# Patient Record
Sex: Male | Born: 1980 | Race: White | Hispanic: No | Marital: Married | Smoking: Former smoker
Health system: Southern US, Community
[De-identification: ages and names within clinical notes are randomized; demographics above are authoritative.]

## PROBLEM LIST (undated history)

## (undated) DIAGNOSIS — I10 Essential (primary) hypertension: Secondary | ICD-10-CM

## (undated) HISTORY — PX: WISDOM TOOTH EXTRACTION: SHX21

## (undated) HISTORY — DX: Essential (primary) hypertension: I10

---

## 2016-10-11 ENCOUNTER — Encounter: Payer: Self-pay | Admitting: Family

## 2016-10-11 ENCOUNTER — Ambulatory Visit (INDEPENDENT_AMBULATORY_CARE_PROVIDER_SITE_OTHER): Payer: BC Managed Care – PPO | Admitting: Family

## 2016-10-11 VITALS — BP 110/74 | HR 87 | Temp 98.8°F | Resp 16 | Ht 70.0 in | Wt 203.0 lb

## 2016-10-11 DIAGNOSIS — Z0000 Encounter for general adult medical examination without abnormal findings: Secondary | ICD-10-CM | POA: Diagnosis not present

## 2016-10-11 DIAGNOSIS — K649 Unspecified hemorrhoids: Secondary | ICD-10-CM | POA: Diagnosis not present

## 2016-10-11 MED ORDER — HYDROCORTISONE 2.5 % RE CREA: 1.0000 "application " | TOPICAL_CREAM | Freq: Two times a day (BID) | RECTAL | 0 refills | Status: DC

## 2016-10-11 NOTE — Patient Instructions (Addendum)
Thank you for choosing [facility].  SUMMARY AND INSTRUCTIONS:  Labs:  Please stop by the lab on the lower level of the building for your blood work. Your results will be released to MyChart (or called to you) after review, usually within 72 hours after test completion. If any changes need to be made, you will be notified at that same time.  1.) The lab is open from 7:30am to 5:30 pm Monday-Friday 2.) No appointment is necessary 3.) Fasting (if needed) is 6-8 hours after food and drink; black coffee and water are okay   Follow up:  If your symptoms worsen or fail to improve, please contact our office for further instruction, or in case of emergency go directly to the emergency room at the closest medical facility.    Health Maintenance, Male A healthy lifestyle and preventive care is important for your health and wellness. Ask your health care provider about what schedule of regular examinations is right for you. What should I know about weight and diet?  Eat a Healthy Diet  Eat plenty of vegetables, fruits, whole grains, low-fat dairy products, and lean protein.  Do not eat a lot of foods high in solid fats, added sugars, or salt. Maintain a Healthy Weight  Regular exercise can help you achieve or maintain a healthy weight. You should:  Do at least 150 minutes of exercise each week. The exercise should increase your heart rate and make you sweat (moderate-intensity exercise).  Do strength-training exercises at least twice a week. Watch Your Levels of Cholesterol and Blood Lipids  Have your blood tested for lipids and cholesterol every 5 years starting at 38 years of age. If you are at high risk for heart disease, you should start having your blood tested when you are 38 years old. You may need to have your cholesterol levels checked more often if:  Your lipid or cholesterol levels are high.  You are older than 38 years of age.  You are at high risk for heart  disease. What should I know about cancer screening? Many types of cancers can be detected early and may often be prevented. Lung Cancer  You should be screened every year for lung cancer if:  You are a current smoker who has smoked for at least 30 years.  You are a former smoker who has quit within the past 15 years.  Talk to your health care provider about your screening options, when you should start screening, and how often you should be screened. Colorectal Cancer  Routine colorectal cancer screening usually begins at 38 years of age and should be repeated every 5-10 years until you are 38 years old. You may need to be screened more often if early forms of precancerous polyps or small growths are found. Your health care provider may recommend screening at an earlier age if you have risk factors for colon cancer.  Your health care provider may recommend using home test kits to check for hidden blood in the stool.  A small camera at the end of a tube can be used to examine your colon (sigmoidoscopy or colonoscopy). This checks for the earliest forms of colorectal cancer. Prostate and Testicular Cancer  Depending on your age and overall health, your health care provider may do certain tests to screen for prostate and testicular cancer.  Talk to your health care provider about any symptoms or concerns you have about testicular or prostate cancer. Skin Cancer  Check your skin from head to   toe regularly.  Tell your health care provider about any new moles or changes in moles, especially if:  There is a change in a mole's size, shape, or color.  You have a mole that is larger than a pencil eraser.  Always use sunscreen. Apply sunscreen liberally and repeat throughout the day.  Protect yourself by wearing long sleeves, pants, a wide-brimmed hat, and sunglasses when outside. What should I know about heart disease, diabetes, and high blood pressure?  If you are 18-39 years of age,  have your blood pressure checked every 3-5 years. If you are 40 years of age or older, have your blood pressure checked every year. You should have your blood pressure measured twice-once when you are at a hospital or clinic, and once when you are not at a hospital or clinic. Record the average of the two measurements. To check your blood pressure when you are not at a hospital or clinic, you can use:  An automated blood pressure machine at a pharmacy.  A home blood pressure monitor.  Talk to your health care provider about your target blood pressure.  If you are between 45-79 years old, ask your health care provider if you should take aspirin to prevent heart disease.  Have regular diabetes screenings by checking your fasting blood sugar level.  If you are at a normal weight and have a low risk for diabetes, have this test once every three years after the age of 45.  If you are overweight and have a high risk for diabetes, consider being tested at a younger age or more often.  A one-time screening for abdominal aortic aneurysm (AAA) by ultrasound is recommended for men aged 65-75 years who are current or former smokers. What should I know about preventing infection? Hepatitis B  If you have a higher risk for hepatitis B, you should be screened for this virus. Talk with your health care provider to find out if you are at risk for hepatitis B infection. Hepatitis C  Blood testing is recommended for:  Everyone born from 1945 through 1965.  Anyone with known risk factors for hepatitis C. Sexually Transmitted Diseases (STDs)  You should be screened each year for STDs including gonorrhea and chlamydia if:  You are sexually active and are younger than 38 years of age.  You are older than 38 years of age and your health care provider tells you that you are at risk for this type of infection.  Your sexual activity has changed since you were last screened and you are at an increased risk for  chlamydia or gonorrhea. Ask your health care provider if you are at risk.  Talk with your health care provider about whether you are at high risk of being infected with HIV. Your health care provider may recommend a prescription medicine to help prevent HIV infection. What else can I do?  Schedule regular health, dental, and eye exams.  Stay current with your vaccines (immunizations).  Do not use any tobacco products, such as cigarettes, chewing tobacco, and e-cigarettes. If you need help quitting, ask your health care provider.  Limit alcohol intake to no more than 2 drinks per day. One drink equals 12 ounces of beer, 5 ounces of wine, or 1 ounces of hard liquor.  Do not use street drugs.  Do not share needles.  Ask your health care provider for help if you need support or information about quitting drugs.  Tell your health care provider if you often   feel depressed.  Tell your health care provider if you have ever been abused or do not feel safe at home. This information is not intended to replace advice given to you by your health care provider. Make sure you discuss any questions you have with your health care provider. Document Released: 12/08/2007 Document Revised: 02/08/2016 Document Reviewed: 03/15/2015 Elsevier Interactive Patient Education  2017 [employer]vier Inc.   

## 2016-10-11 NOTE — Assessment & Plan Note (Signed)
1) Anticipatory Guidance: Discussed importance of wearing a seatbelt while driving and not texting while driving; changing batteries in smoke detector at least once annually; wearing suntan lotion when outside; eating a balanced and moderate diet; getting physical activity at least 30 minutes per day.  2) Immunizations / Screenings / Labs:  All immunizations are up-to-date per recommendations. Due for a dental screen encouraged to be completed independently. All other screenings are up-to-date per recommendations. Obtain CBC, CMET, and lipid profile.   Overall well exam with risk factors for cardiovascular disease including overweight with a BMI of 29. Recommend weight loss of 5-10% of current body weight through nutrition and physical activity. Health goal is to lose additional weight. Blood pressure is well controlled since changing his nutrition is currently on a ketogenic diet. Continue other healthy lifestyle behaviors and choices. Follow-up prevention exam in 1 year. Follow-up office visit pending blood work if necessary.

## 2016-10-11 NOTE — Assessment & Plan Note (Signed)
History of hemorrhoid that is aggravated with bowel movements and has been refractory to OTC medications. Start Ansuol. Encouraged to avoid constipation and straining and perform good rectal hygiene. Continue to monitor.

## 2016-10-11 NOTE — Progress Notes (Signed)
Subjective:    Patient ID: Devin Moreno, male    DOB: 1980/07/14, 36 y.o.   MRN: [id number]  Chief Complaint  Patient presents with  . Establish Care    CPE, not fasting    HPI:  Devin Moreno is a 36 y.o. male who presents today for an annual wellness visit.   1) Health Maintenance -   Diet - Averaging about 2 meals per day consisting of a Ketogenic diet; Caffeine intake of 1-2 cups per day on average  Exercise - Farm work; walking    2) Corporate investment banker / Immunizations:  Dental -- Due for exam  Vision -- Up to date; wears contacts   Health Maintenance  Topic Date Due  . HIV Screening  03/18/1994  . INFLUENZA VACCINE  01/23/2017  . TETANUS/TDAP  06/28/2024     There is no immunization history on file for this patient.   No Known Allergies   No outpatient prescriptions prior to visit.   No facility-administered medications prior to visit.      Past Medical History:  Diagnosis Date  . Hypertension      Past Surgical History:  Procedure Laterality Date  . WISDOM TOOTH EXTRACTION       Family History  Problem Relation Age of Onset  . Healthy Mother   . Hypertension Father   . Heart disease Father   . Tuberculosis Maternal Grandmother   . Prostate cancer Maternal Grandfather      Social History   Social History  . Marital status: Married    Spouse name: N/A  . Number of children: 1  . Years of education: 11   Occupational History  . Artist    Social History Main Topics  . Smoking status: Former Smoker    Packs/day: 1.00    Years: 16.00    Types: Cigarettes  . Smokeless tobacco: Never Used  . Alcohol use 4.2 oz/week    7 Cans of beer per week  . Drug use: No  . Sexual activity: Not on file   Other Topics Concern  . Not on file   Social History Narrative   Fun/Hobby: Farm and Aeronautical engineer.       Review of Systems  Constitutional: Denies fever, chills, fatigue, or significant weight  gain/loss. HENT: Head: Denies headache or neck pain Ears: Denies changes in hearing, ringing in ears, earache, drainage Nose: Denies discharge, stuffiness, itching, nosebleed, sinus pain Throat: Denies sore throat, hoarseness, dry mouth, sores, thrush Eyes: Denies loss/changes in vision, pain, redness, blurry/double vision, flashing lights Cardiovascular: Denies chest pain/discomfort, tightness, palpitations, shortness of breath with activity, difficulty lying down, swelling, sudden awakening with shortness of breath Respiratory: Denies shortness of breath, cough, sputum production, wheezing Gastrointestinal: Denies dysphasia, heartburn, change in appetite, nausea, change in bowel habits, rectal bleeding, constipation, diarrhea, yellow skin or eyes Genitourinary: Denies frequency, urgency, burning/pain, blood in urine, incontinence, change in urinary strength. Musculoskeletal: Denies muscle/joint pain, stiffness, back pain, redness or swelling of joints, trauma Skin: Denies rashes, lumps, itching, dryness, color changes, or hair/nail changes Neurological: Denies dizziness, fainting, seizures, weakness, numbness, tingling, tremor Psychiatric - Denies nervousness, stress, depression or memory loss Endocrine: Denies heat or cold intolerance, sweating, frequent urination, excessive thirst, changes in appetite Hematologic: Denies ease of bruising or bleeding     Objective:     BP 110/74 (BP Location: Left Arm, Patient Position: Sitting, Cuff Size: Large)   Pulse 87   Temp 98.8 F (37.1 C) (Oral)  Resp 16   Ht  (1.778 m)   Wt 203 lb (92.1 kg)   SpO2 95%   BMI 29.13 kg/m  Nursing note and vital signs reviewed.  Physical Exam  Constitutional: He is oriented to person, place, and time. He appears well-developed and well-nourished. No distress.  HENT:  Head: Normocephalic.  Right Ear: Hearing, tympanic membrane, external ear and ear canal normal.  Left Ear: Hearing, tympanic  membrane, external ear and ear canal normal.  Nose: Nose normal.  Mouth/Throat: Uvula is midline, oropharynx is clear and moist and mucous membranes are normal.  Eyes: Conjunctivae and EOM are normal. Pupils are equal, round, and reactive to light.  Neck: Neck supple. No JVD present. No tracheal deviation present. No thyromegaly present.  Cardiovascular: Normal rate, regular rhythm, normal heart sounds and intact distal pulses.   Pulmonary/Chest: Effort normal and breath sounds normal.  Abdominal: Soft. Bowel sounds are normal. He exhibits no distension and no mass. There is no tenderness. There is no rebound and no guarding.  Musculoskeletal: Normal range of motion. He exhibits no edema or tenderness.  Lymphadenopathy:    He has no cervical adenopathy.  Neurological: He is alert and oriented to person, place, and time. He has normal reflexes. No cranial nerve deficit. He exhibits normal muscle tone. Coordination normal.  Skin: Skin is warm and dry.  Psychiatric: He has a normal mood and affect. His behavior is normal. Judgment and thought content normal.       Assessment & Plan:   Problem List Items Addressed This Visit      Cardiovascular and Mediastinum   Hemorrhoid    History of hemorrhoid that is aggravated with bowel movements and has been refractory to OTC medications. Start Ansuol. Encouraged to avoid constipation and straining and perform good rectal hygiene. Continue to monitor.         Other   Routine adult health maintenance - Primary    1) Anticipatory Guidance: Discussed importance of wearing a seatbelt while driving and not texting while driving; changing batteries in smoke detector at least once annually; wearing suntan lotion when outside; eating a balanced and moderate diet; getting physical activity at least 30 minutes per day.  2) Immunizations / Screenings / Labs:  All immunizations are up-to-date per recommendations. Due for a dental screen encouraged to be  completed independently. All other screenings are up-to-date per recommendations. Obtain CBC, CMET, and lipid profile.   Overall well exam with risk factors for cardiovascular disease including overweight with a BMI of 29. Recommend weight loss of 5-10% of current body weight through nutrition and physical activity. Health goal is to lose additional weight. Blood pressure is well controlled since changing his nutrition is currently on a ketogenic diet. Continue other healthy lifestyle behaviors and choices. Follow-up prevention exam in 1 year. Follow-up office visit pending blood work if necessary.        Relevant Orders   CBC   Comprehensive metabolic panel   Lipid panel       I am having Mr. Curci start on hydrocortisone.   Meds ordered this encounter  Medications  . hydrocortisone (ANUSOL-HC) 2.5 % rectal cream    Sig: Place 1 application rectally 2 (two) times daily.    Dispense:  30 g    Refill:  0    Order Specific Question:   Supervising Provider    Answer:   Hillard Danker A [[id number]]     Follow-up: Return in about 1 year (around 10/11/2017),  or if symptoms worsen or fail to improve.   Jeanine Luz, FNP

## 2016-11-17 ENCOUNTER — Encounter: Payer: Self-pay | Admitting: Family Medicine

## 2016-11-17 ENCOUNTER — Ambulatory Visit (INDEPENDENT_AMBULATORY_CARE_PROVIDER_SITE_OTHER): Payer: BC Managed Care – PPO | Admitting: Family Medicine

## 2016-11-17 VITALS — BP 104/76 | HR 80 | Temp 99.3°F | Resp 16 | Ht 70.0 in | Wt 221.0 lb

## 2016-11-17 DIAGNOSIS — M542 Cervicalgia: Secondary | ICD-10-CM | POA: Diagnosis not present

## 2016-11-17 DIAGNOSIS — M792 Neuralgia and neuritis, unspecified: Secondary | ICD-10-CM | POA: Diagnosis not present

## 2016-11-17 MED ORDER — METHOCARBAMOL 500 MG PO TABS: 500.0000 mg | ORAL_TABLET | Freq: Three times a day (TID) | ORAL | 0 refills | Status: AC | PRN

## 2016-11-17 MED ORDER — PREDNISONE 20 MG PO TABS: ORAL_TABLET | ORAL | 0 refills | Status: AC

## 2016-11-17 NOTE — Progress Notes (Signed)
HPI:   ACUTE VISIT:  Chief Complaint  Patient presents with  . Neck Pain    [name] is a 36 y.o. male, who is here today complaining of cervical pain for about a month. 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 He denies prior Hx  Pain can be as bad as 9/10. Exacerbated by movement and alleviated by rest. + Limitation of ROM.  "Patchy" areas of numbness and tingling along LUE intermittently.  Neck Pain   This is a new problem. The current episode started 1 to 4 weeks ago. The problem occurs constantly. The problem has been gradually improving. The pain is associated with an unknown factor. The pain is present in the left side. The quality of the pain is described as aching. The pain is at a severity of 7/10. The pain is moderate. The symptoms are aggravated by twisting. The pain is same all the time. Associated symptoms include numbness and tingling. Pertinent negatives include no chest pain, fever, headaches, pain with swallowing, paresis, syncope, trouble swallowing, visual change, weakness or weight loss. He has tried chiropractic manipulation and NSAIDs for the symptoms. The treatment provided mild relief.   He had cervical MRI 10/30/16: There is a trace disc posterior disc bulge at C2-3 eccentric to the right,with no associated stenosis of the canal or foramina.    The described broad-based posterior disc osteophyte complexes at the C5-6 and C6-7 levels are considered disc bulges with small associated osteophytes. Appt with neurosurgeon has been already arranged for 12/2016.   Review of Systems  Constitutional: Negative for appetite change, chills, fatigue, fever and weight loss.  HENT: Negative for sore throat and trouble swallowing.   Eyes: Negative for redness and visual disturbance.  Respiratory: Negative for  shortness of breath and wheezing.   Cardiovascular: Negative for chest pain and syncope.  Gastrointestinal: Negative for nausea and vomiting.  Musculoskeletal: Positive for arthralgias and neck pain. Negative for joint swelling.  Skin: Negative for pallor and rash.  Neurological: Positive for tingling and numbness. Negative for weakness and headaches.  Hematological: Negative for adenopathy. Does not bruise/bleed easily.  Psychiatric/Behavioral: Negative for confusion. The patient is nervous/anxious.    He is on Ibuprofen 800 mg a "few times per day."   Current Outpatient Prescriptions on File Prior to Visit  Medication Sig Dispense Refill  . hydrocortisone (ANUSOL-HC) 2.5 % rectal cream Place 1 application rectally 2 (two) times daily. 30 Moreno 0   No current facility-administered medications on file prior to visit.      Past Medical History:  Diagnosis Date  . Hypertension    No Known Allergies  Social History   Social History  . Marital status: Married    Spouse name: N/A  . Number of children: 1  . Years of education: 65   Occupational History  . Artist    Social History Main Topics  . Smoking status: Former Smoker    Packs/day: 1.00    Years: 16.00    Types: Cigarettes  . Smokeless tobacco: Never Used  . Alcohol use 4.2 oz/week    7 Cans of beer per week  . Drug use: No  . Sexual activity: Not Asked   Other Topics Concern  . None   Social History Narrative   Fun/Hobby: Farm and Aeronautical engineer.  Vitals:   11/17/16 1019  BP: 104/76  Pulse: 80  Resp: 16  Temp: 99.3 F (37.4 C)  O2 st at RA 98% Body mass index is 31.71 kg/m.   Physical Exam  Nursing note and vitals reviewed. Constitutional: He is oriented to person, place, and time. He appears well-developed. No distress.  HENT:  Head: Atraumatic.  Mouth/Throat: Oropharynx is clear and moist and mucous membranes are normal.  Eyes: Conjunctivae and EOM are normal.  Cardiovascular: Normal  rate and regular rhythm.   No murmur heard. Pulses:      Radial pulses are 2+ on the right side, and 2+ on the left side.  Respiratory: Effort normal and breath sounds normal. No respiratory distress.  Musculoskeletal: He exhibits no edema.       Cervical back: He exhibits decreased range of motion. He exhibits no bony tenderness.       Thoracic back: He exhibits no tenderness and no bony tenderness.  No significant deformity appreciated. + Tenderness upon palpation of left trapezium, no pain of paraspinal muscles.+ Muscle spasm. Pain elicited with cervical movement, limitation ROM and elicits LUE numbness. Limitation of active ROM left shoulder, passive movement adequate.Elicits pain of extremity.  No local edema or erythema appreciated, no suspicious lesions.  Lymphadenopathy:    He has no cervical adenopathy.  Neurological: He is alert and oriented to person, place, and time. He has normal strength. Coordination and gait normal.  Skin: Skin is warm. No rash noted. No erythema.  Psychiatric: His mood appears anxious.  Well groomed,good eye contact.     ASSESSMENT AND PLAN:   Devin Moreno was seen today for neck pain.  Diagnoses and all orders for this visit:  Cervicalgia -     methocarbamol (ROBAXIN) 500 MG tablet; Take 1 tablet (500 mg total) by mouth every 8 (eight) hours as needed for muscle spasms.  Radicular pain in left arm -     predniSONE (DELTASONE) 20 MG tablet; 3 tabs for 3 days, 2 tabs for 3 days, 1 tabs for 3 days, and 1/2 tab for 3 days. Take tables together with breakfast.   We discussed treatment options, he agrees with trying oral Prednisone taper. Muscle relaxant may also help. Local heat. Avoid activities that can aggravate pain. Some side effects of medications discussed. Instructed about warning signs. He will keen appt with neurosurgeon.  He voices understanding.   Return if symptoms worsen or fail to improve.    Devin Moreno. [place], MD  [facility]. [place] [facility].

## 2016-11-17 NOTE — Patient Instructions (Signed)
  [name] I have seen you today for an acute visit.  A few things to remember from today's visit:   Cervicalgia - Plan: methocarbamol (ROBAXIN) 500 MG tablet  Radicular pain in left arm - Plan: predniSONE (DELTASONE) 20 MG tablet  Prednisone with breakfast. Methocarbamol causes drowsiness.   Medications prescribed today are intended for short period of time and will not be refill upon request, a follow up appointment might be necessary to discuss continuation of of treatment if appropriate.     In general please monitor for signs of worsening symptoms and seek immediate medical attention if any concerning.  If symptoms are not resolved in 3-4 weeks you should schedule a follow up appointment with your doctor, before if needed.

## 2016-11-17 NOTE — Progress Notes (Signed)
Pre visit review using our clinic review tool, if applicable. No additional management support is needed unless otherwise documented below in the visit note. 

## 2017-07-16 ENCOUNTER — Ambulatory Visit: Payer: BC Managed Care – PPO | Admitting: Family Medicine

## 2017-07-16 ENCOUNTER — Encounter: Payer: Self-pay | Admitting: Family Medicine

## 2017-07-16 VITALS — BP 140/82 | HR 107 | Temp 99.7°F | Ht 70.0 in | Wt 203.0 lb

## 2017-07-16 DIAGNOSIS — R6889 Other general symptoms and signs: Secondary | ICD-10-CM | POA: Diagnosis not present

## 2017-07-16 NOTE — Progress Notes (Signed)
Devin Moreno - 37 y.o. male MRN [id number]  Date of birth: 02-26-1981  SUBJECTIVE:  Including CC & ROS.  Chief Complaint  Patient presents with  . Sinusitis    Devin Moreno is a 37 y.o. male that is presenting with sinus pressure/drainage and cough. Symptoms started about two days ago. Admits to chills, fevers and body aches. Fever last night was 102. Denies getting flu shot. He has not been around that has been sick. He took Sudafed last night with no improvement. He feels like his symptoms have stayed the same. He had pneumonia last year and felt his symptoms were similar to that.      Review of Systems  Constitutional: Positive for fever.  HENT: Positive for sinus pain.   Respiratory: Negative for cough.   Cardiovascular: Negative for chest pain.  Gastrointestinal: Negative for abdominal pain.  Neurological: Negative for weakness.  Hematological: Negative for adenopathy.  Psychiatric/Behavioral: Negative for agitation.    HISTORY: Past Medical, Surgical, Social, and Family History Reviewed & Updated per EMR.   Pertinent Historical Findings include:  Past Medical History:  Diagnosis Date  . Hypertension     Past Surgical History:  Procedure Laterality Date  . WISDOM TOOTH EXTRACTION      No Known Allergies  Family History  Problem Relation Age of Onset  . Healthy Mother   . Hypertension Father   . Heart disease Father   . Tuberculosis Maternal Grandmother   . Prostate cancer Maternal Grandfather      Social History   Socioeconomic History  . Marital status: Married    Spouse name: Not on file  . Number of children: 1  . Years of education: 65  . Highest education level: Not on file  Social Needs  . Financial resource strain: Not on file  . Food insecurity - worry: Not on file  . Food insecurity - inability: Not on file  . Transportation needs - medical: Not on file  . Transportation needs - non-medical: Not on file  Occupational History  .  Occupation: Artist  Tobacco Use  . Smoking status: Former Smoker    Packs/day: 1.00    Years: 16.00    Pack years: 16.00    Types: Cigarettes  . Smokeless tobacco: Never Used  Substance and Sexual Activity  . Alcohol use: Yes    Alcohol/week: 4.2 oz    Types: 7 Cans of beer per week  . Drug use: No  . Sexual activity: Not on file  Other Topics Concern  . Not on file  Social History Narrative   Fun/Hobby: Farm and Aeronautical engineer.      PHYSICAL EXAM:  VS: BP 140/82 (BP Location: Left Arm, Patient Position: Sitting, Cuff Size: Normal)   Pulse (!) 107   Temp 99.7 F (37.6 C) (Oral)   Ht 5\' 10"  (1.778 m)   Wt 203 lb (92.1 kg)   SpO2 96%   BMI 29.13 kg/m  Physical Exam Gen: NAD, alert, cooperative with exam, ENT: normal lips, normal nasal mucosa, tympanic membranes clear and intact bilaterally, normal oropharynx, no cervical lymphadenopathy Eye: normal EOM, normal conjunctiva and lids CV:  no edema, +2 pedal pulses, regular rate and rhythm, S1-S2   Resp: no accessory muscle use, non-labored, clear to auscultation bilaterally, no crackles or wheezes Skin: no rashes, no areas of induration  Neuro: normal tone, normal sensation to touch Psych:  normal insight, alert and oriented MSK: Normal gait, normal strength  ASSESSMENT & PLAN:   Flu-like symptoms Symptoms appear to be flu like in nature.  - counseled on supportive care  - given indications to return.

## 2017-07-16 NOTE — Assessment & Plan Note (Signed)
Symptoms appear to be flu like in nature.  - counseled on supportive care  - given indications to return.

## 2017-07-16 NOTE — Patient Instructions (Signed)
Please let us know if your symptoms don't improve or worsen

## 2017-10-02 ENCOUNTER — Ambulatory Visit: Payer: BC Managed Care – PPO | Admitting: Family

## 2017-10-02 ENCOUNTER — Encounter: Payer: Self-pay | Admitting: Family

## 2017-10-02 ENCOUNTER — Ambulatory Visit: Payer: Self-pay

## 2017-10-02 VITALS — BP 128/76 | HR 96 | Temp 98.7°F | Ht 70.0 in | Wt 216.0 lb

## 2017-10-02 DIAGNOSIS — J019 Acute sinusitis, unspecified: Secondary | ICD-10-CM

## 2017-10-02 DIAGNOSIS — R42 Dizziness and giddiness: Secondary | ICD-10-CM | POA: Diagnosis not present

## 2017-10-02 MED ORDER — AMOXICILLIN-POT CLAVULANATE 875-125 MG PO TABS: 1.0000 | ORAL_TABLET | Freq: Two times a day (BID) | ORAL | 0 refills | Status: DC

## 2017-10-02 MED ORDER — MECLIZINE HCL 25 MG PO TABS: 25.0000 mg | ORAL_TABLET | Freq: Three times a day (TID) | ORAL | 0 refills | Status: DC | PRN

## 2017-10-02 NOTE — Telephone Encounter (Signed)
   Reason for Disposition . [1] MODERATE dizziness (e.g., interferes with normal activities) AND [2] has NOT been evaluated by physician for this  (Exception: dizziness caused by heat exposure, sudden standing, or poor fluid intake)  Answer Assessment - Initial Assessment Questions 1. DESCRIPTION: "Describe your dizziness."     Spinning 2. LIGHTHEADED: "Do you feel lightheaded?" (e.g., somewhat faint, woozy, weak upon standing)     Dizzy 3. VERTIGO: "Do you feel like either you or the room is spinning or tilting?" (i.e. vertigo)     Yes - after moving head or standing up 4. SEVERITY: "How bad is it?"  "Do you feel like you are going to faint?" "Can you stand and walk?"   - MILD - walking normally   - MODERATE - interferes with normal activities (e.g., work, school)    - SEVERE - unable to stand, requires support to walk, feels like passing out now.      Mild 5. ONSET:  "When did the dizziness begin?"     Started yesterday 6. AGGRAVATING FACTORS: "Does anything make it worse?" (e.g., standing, change in head position)     Changing positions 7. HEART RATE: "Can you tell me your heart rate?" "How many beats in 15 seconds?"  (Note: not all patients can do this)       BP 117/72 PULSE IS FINE 8. CAUSE: "What do you think is causing the dizziness?"     Unsure 9. RECURRENT SYMPTOM: "Have you had dizziness before?" If so, ask: "When was the last time?" "What happened that time?"     No 10. OTHER SYMPTOMS: "Do you have any other symptoms?" (e.g., fever, chest pain, vomiting, diarrhea, bleeding)        Had a stomach bug 11. PREGNANCY: "Is there any chance you are pregnant?" "When was your last menstrual period?"       No  Protocols used: DIZZINESS [facility]- LIGHTHEADEDNESS-A-AH

## 2017-10-02 NOTE — Patient Instructions (Signed)

## 2017-10-02 NOTE — Progress Notes (Signed)
Devin Moreno is a 37 y.o. male with the following history as recorded in EpicCare:  Patient Active Problem List   Diagnosis Date Noted  . Flu-like symptoms 07/16/2017  . Routine adult health maintenance 10/11/2016  . Hemorrhoid 10/11/2016    Current Outpatient Medications  Medication Sig Dispense Refill  . amoxicillin-clavulanate (AUGMENTIN) 875-125 MG tablet Take 1 tablet by mouth 2 (two) times daily. 20 tablet 0  . meclizine (ANTIVERT) 25 MG tablet Take 1 tablet (25 mg total) by mouth 3 (three) times daily as needed for dizziness. 30 tablet 0   No current facility-administered medications for this visit.     Allergies: Patient has no known allergies.  Past Medical History:  Diagnosis Date  . Hypertension     Past Surgical History:  Procedure Laterality Date  . WISDOM TOOTH EXTRACTION      Family History  Problem Relation Age of Onset  . Healthy Mother   . Hypertension Father   . Heart disease Father   . Tuberculosis Maternal Grandmother   . Prostate cancer Maternal Grandfather     Social History   Tobacco Use  . Smoking status: Former Smoker    Packs/day: 1.00    Years: 16.00    Pack years: 16.00    Types: Cigarettes  . Smokeless tobacco: Never Used  Substance Use Topics  . Alcohol use: Yes    Alcohol/week: 4.2 oz    Types: 7 Cans of beer per week    Subjective:  Dizziness x 1 day; started suddenly; did have stomach bug this past weekend but feels back to normal now; feels like the room is spinning around him; + nauseated; no blurred vision, no headache; symptoms are worse with movement;  Also mentions chronic "putrid" taste in the back of his mouth x 3 months; does have large tonsils; wonders about chronic infection?  Objective:  Vitals:   10/02/17 1356  BP: 128/76  Pulse: 96  Temp: 98.7 F (37.1 C)  TempSrc: Oral  SpO2: 96%  Weight: 216 lb 0.6 oz (98 kg)  Height: 5\' 10"  (1.778 m)    General: Well developed, well nourished, in no acute distress   Skin : Warm and dry.  Head: Normocephalic and atraumatic  Eyes: Sclera and conjunctiva clear; pupils round and reactive to light; extraocular movements intact  Ears: External normal; canals clear; tympanic membranes normal  Oropharynx: Pink, supple. No suspicious lesions  Neck: Supple without thyromegaly, adenopathy  Lungs: Respirations unlabored; clear to auscultation bilaterally without wheeze, rales, rhonchi  CVS exam: normal rate and regular rhythm.  Neurologic: Alert and oriented; speech intact; face symmetrical; moves all extremities well; CNII-XII intact without focal deficit   Assessment:  1. Vertigo   2. Acute sinusitis, recurrence not specified, unspecified location     Plan:  1. Reassurance; suspect benign/ probably related to dehydration from recent viral illness; will treat with Antivert 25 mg tid prn; increase fluids, rest; follow-up worse, no better;  2. Due to chronic taste in back of throat, will treat for suspected sinus infection; Rx for Augmentin 875 mg bid x 10 days; he has already seen his dentist- if symptoms persist, would recommend ENT evaluation due to size of tonsils.  He is reminded that he is overdue for yearly labs- he will come back at later date/ wife is due with their second child in the next 1-2 weeks.    No follow-ups on file.  No orders of the defined types were placed in this encounter.  Requested Prescriptions   Signed Prescriptions Disp Refills  . meclizine (ANTIVERT) 25 MG tablet 30 tablet 0    Sig: Take 1 tablet (25 mg total) by mouth 3 (three) times daily as needed for dizziness.  Marland Kitchen amoxicillin-clavulanate (AUGMENTIN) 875-125 MG tablet 20 tablet 0    Sig: Take 1 tablet by mouth 2 (two) times daily.

## 2017-10-10 ENCOUNTER — Encounter (HOSPITAL_COMMUNITY): Payer: Self-pay

## 2017-10-10 ENCOUNTER — Emergency Department (HOSPITAL_COMMUNITY)
Admission: EM | Admit: 2017-10-10 | Discharge: 2017-10-11 | Disposition: A | Discharge status: Home / Self Care | Payer: BC Managed Care – PPO | Attending: Emergency Medicine | Admitting: Emergency Medicine

## 2017-10-10 DIAGNOSIS — Z87891 Personal history of nicotine dependence: Secondary | ICD-10-CM | POA: Diagnosis not present

## 2017-10-10 DIAGNOSIS — Y999 Unspecified external cause status: Secondary | ICD-10-CM | POA: Insufficient documentation

## 2017-10-10 DIAGNOSIS — Y9389 Activity, other specified: Secondary | ICD-10-CM | POA: Diagnosis not present

## 2017-10-10 DIAGNOSIS — W5532XA Struck by other hoof stock, initial encounter: Secondary | ICD-10-CM | POA: Diagnosis not present

## 2017-10-10 DIAGNOSIS — S01312A Laceration without foreign body of left ear, initial encounter: Secondary | ICD-10-CM | POA: Diagnosis not present

## 2017-10-10 DIAGNOSIS — Y9273 Farm field as the place of occurrence of the external cause: Secondary | ICD-10-CM | POA: Insufficient documentation

## 2017-10-10 DIAGNOSIS — I10 Essential (primary) hypertension: Secondary | ICD-10-CM | POA: Diagnosis not present

## 2017-10-10 NOTE — ED Triage Notes (Signed)
Pt presents with laceration to L earlobe; reports picking up a goat over a fence when it speared pt in the ear with his horn; no damage to inner ear, pt seen at urgent care and referred here due to torn cartilage.

## 2017-10-11 MED ORDER — LIDOCAINE HCL (PF) 1 % IJ SOLN
5.0000 mL | Freq: Once | INTRAMUSCULAR | Status: AC
  Administered 2017-10-11: 5 mL via INTRADERMAL
  Filled 2017-10-11: qty 5

## 2017-10-11 MED ORDER — AMOXICILLIN-POT CLAVULANATE 875-125 MG PO TABS: 1.0000 | ORAL_TABLET | Freq: Two times a day (BID) | ORAL | 0 refills | Status: DC

## 2017-10-11 NOTE — ED Provider Notes (Signed)
[facility] Provider Note   CSN: [id number] Arrival date & time: 10/10/17  1951     History   Chief Complaint Chief Complaint  Patient presents with  . Laceration    HPI Devin Moreno is a 37 y.o. male.  The history is provided by the patient and medical records.     37 y.o. M with hx of HTN, presenting to the ED for left ear laceration.  He was lifting a goat over the fence when he bucked and speared him in the ear with her horn.  No LOC.  Went to urgent care and sent here with concern for cartilage injury.  Tetanus UTD.  Past Medical History:  Diagnosis Date  . Hypertension     Patient Active Problem List   Diagnosis Date Noted  . Flu-like symptoms 07/16/2017  . Routine adult health maintenance 10/11/2016  . Hemorrhoid 10/11/2016    Past Surgical History:  Procedure Laterality Date  . WISDOM TOOTH EXTRACTION          Home Medications    Prior to Admission medications   Medication Sig Start Date End Date Taking? Authorizing Provider  amoxicillin-clavulanate (AUGMENTIN) 875-125 MG tablet Take 1 tablet by mouth 2 (two) times daily. 10/02/17   Olive BassMurray, Laura Woodruff, FNP  meclizine (ANTIVERT) 25 MG tablet Take 1 tablet (25 mg total) by mouth 3 (three) times daily as needed for dizziness. 10/02/17   Olive BassMurray, Laura Woodruff, FNP    Family History Family History  Problem Relation Age of Onset  . Healthy Mother   . Hypertension Father   . Heart disease Father   . Tuberculosis Maternal Grandmother   . Prostate cancer Maternal Grandfather     Social History Social History   Tobacco Use  . Smoking status: Former Smoker    Packs/day: 1.00    Years: 16.00    Pack years: 16.00    Types: Cigarettes  . Smokeless tobacco: Never Used  Substance Use Topics  . Alcohol use: Yes    Alcohol/week: 4.2 oz    Types: 7 Cans of beer per week  . Drug use: No     Allergies   Patient has no known allergies.   Review of  Systems Review of Systems  Skin: Positive for wound.  All other systems reviewed and are negative.    Physical Exam Updated Vital Signs BP (!) 123/91   Pulse 95   Temp 98.1 F (36.7 C)   Resp 18   SpO2 97%   Physical Exam  Constitutional: He is oriented to person, place, and time. He appears well-developed and well-nourished.  HENT:  Head: Normocephalic and atraumatic.  Mouth/Throat: Oropharynx is clear and moist.  Area of small laceration to tragus as well as 1cm lac through the earlobe; there does appear to be a very small amount of cartilage involved but otherwise remains intact; laceration is not through and through (see photo below)  Eyes: Pupils are equal, round, and reactive to light. Conjunctivae and EOM are normal.  Neck: Normal range of motion.  Cardiovascular: Normal rate, regular rhythm and normal heart sounds.  Pulmonary/Chest: Effort normal and breath sounds normal.  Abdominal: Soft. Bowel sounds are normal.  Musculoskeletal: Normal range of motion.  Neurological: He is alert and oriented to person, place, and time.  Skin: Skin is warm and dry.  Psychiatric: He has a normal mood and affect.  Nursing note and vitals reviewed.      ED Treatments / Results  Labs (all labs ordered are listed, but only abnormal results are displayed) Labs Reviewed - No data to display  EKG None  Radiology No results found.  Procedures Procedures (including critical care time)  LACERATION REPAIR Performed by: Garlon Hatchet Authorized by: Garlon Hatchet Consent: Verbal consent obtained. Risks and benefits: risks, benefits and alternatives were discussed Consent given by: patient Patient identity confirmed: provided demographic data Prepped and Draped in normal sterile fashion Wound explored  Laceration Location: left ear, lobe  Laceration Length: 4cm  No Foreign Bodies seen or palpated  Anesthesia: local infiltration  Local anesthetic: lidocaine 1% without  epinephrine  Anesthetic total: 3 ml  Irrigation method: syringe Amount of cleaning: standard  Skin closure: 4-0 vicryl rapide  Number of sutures: 5  Technique: simple interrupted  Patient tolerance: Patient tolerated the procedure well with no immediate complications.   Medications Ordered in ED Medications - No data to display   Initial Impression / Assessment and Plan / ED Course  I have reviewed the triage vital signs and the nursing notes.  Pertinent labs & imaging results that were available during my care of the patient were reviewed by me and considered in my medical decision making (see chart for details).  37 year old male who with left ear laceration after he was bucked in the head with a goat horn.  Has laceration to the earlobe.  Seen at urgent care and sent here due to possible cartilage involvement.  Laceration mostly through the earlobe, does appear to be a small amount of cartilage involved but lacerations is rather simple and I feel can be easily repaired.  Patient with small laceration through the tragus as well.  Ear was cleansed, wound is explored.  Laceration  through the tragus with very small skin flap that I do not feel is amenable to repair as small skin flap likely will not hold.  Earlobe laceration was repaired without issue.  Patient tolerated well.  Will cover with antibiotics for infection prophylaxis.  He was given ENT follow-up if any issues.  Discussed home wound care.  He understands to return here for any new or worsening symptoms.  Final Clinical Impressions(s) / ED Diagnoses   Final diagnoses:  Laceration of left earlobe, initial encounter    ED Discharge Orders        Ordered    amoxicillin-clavulanate (AUGMENTIN) 875-125 MG tablet  Every 12 hours     10/11/17 0109       Garlon Hatchet, PA-C 10/11/17 0113    Geoffery Lyons, MD 10/11/17 [phone]

## 2017-10-11 NOTE — Discharge Instructions (Signed)
Sutures will dissolve over the next week. Keep ear clean and dry, do not necessarily have to cover. Take augmentin to help prevent infection. Can use mederma to help with scarring afterwards if desired. Monitor for redness, swelling, drainage, etc.

## 2017-10-11 NOTE — ED Notes (Signed)
Pt discharged from ED; instructions provided and scripts given; Pt encouraged to return to ED if symptoms worsen and to f/u with PCP; Pt verbalized understanding of all instructions 

## 2017-12-27 ENCOUNTER — Other Ambulatory Visit (INDEPENDENT_AMBULATORY_CARE_PROVIDER_SITE_OTHER): Payer: BC Managed Care – PPO

## 2017-12-27 ENCOUNTER — Encounter: Payer: Self-pay | Admitting: Family

## 2017-12-27 ENCOUNTER — Ambulatory Visit: Payer: BC Managed Care – PPO | Admitting: Family

## 2017-12-27 VITALS — BP 130/72 | HR 71 | Temp 97.9°F | Ht 70.0 in | Wt 216.0 lb

## 2017-12-27 DIAGNOSIS — R300 Dysuria: Secondary | ICD-10-CM | POA: Diagnosis not present

## 2017-12-27 DIAGNOSIS — N419 Inflammatory disease of prostate, unspecified: Secondary | ICD-10-CM

## 2017-12-27 DIAGNOSIS — Z13220 Encounter for screening for lipoid disorders: Secondary | ICD-10-CM | POA: Diagnosis not present

## 2017-12-27 LAB — LIPID PANEL
Cholesterol: 157 mg/dL (ref 0–200)
HDL: 48.1 mg/dL (ref >39.00)
LDL Cholesterol: 97 mg/dL (ref 0–99)
NONHDL: 108.63
Total CHOL/HDL Ratio: 3
Triglycerides: 60 mg/dL (ref 0.0–149.0)
VLDL: 12 mg/dL (ref 0.0–40.0)

## 2017-12-27 LAB — POC URINALSYSI DIPSTICK (AUTOMATED)
Bilirubin, UA: NEGATIVE
Blood, UA: NEGATIVE
Glucose, UA: NEGATIVE
KETONES UA: NEGATIVE
Leukocytes, UA: NEGATIVE
Nitrite, UA: NEGATIVE
PH UA: 6 (ref 5.0–8.0)
PROTEIN UA: NEGATIVE
Urobilinogen, UA: 0.2 E.U./dL

## 2017-12-27 LAB — COMPREHENSIVE METABOLIC PANEL
ALT: 18 U/L (ref 0–53)
AST: 14 U/L (ref 0–37)
Albumin: 4.6 g/dL (ref 3.5–5.2)
Alkaline Phosphatase: 39 U/L (ref 39–117)
BUN: 14 mg/dL (ref 6–23)
CO2: 29 mEq/L (ref 19–32)
Calcium: 9.3 mg/dL (ref 8.4–10.5)
Chloride: 105 mEq/L (ref 96–112)
Creatinine, Ser: 0.83 mg/dL (ref 0.40–1.50)
GFR: 109.75 mL/min (ref >60.00)
GLUCOSE: 94 mg/dL (ref 70–99)
POTASSIUM: 4.1 meq/L (ref 3.5–5.1)
SODIUM: 142 meq/L (ref 135–145)
TOTAL PROTEIN: 6.5 g/dL (ref 6.0–8.3)
Total Bilirubin: 1.2 mg/dL (ref 0.2–1.2)

## 2017-12-27 LAB — CBC WITH DIFFERENTIAL/PLATELET
BASOS PCT: 0.5 % (ref 0.0–3.0)
Basophils Absolute: 0 10*3/uL (ref 0.0–0.1)
EOS PCT: 2.8 % (ref 0.0–5.0)
Eosinophils Absolute: 0.2 10*3/uL (ref 0.0–0.7)
HCT: 44.3 % (ref 39.0–52.0)
Hemoglobin: 14.9 g/dL (ref 13.0–17.0)
LYMPHS ABS: 2.2 10*3/uL (ref 0.7–4.0)
Lymphocytes Relative: 39.4 % (ref 12.0–46.0)
MCHC: 33.7 g/dL (ref 30.0–36.0)
MCV: 85 fl (ref 78.0–100.0)
MONOS PCT: 7.7 % (ref 3.0–12.0)
Monocytes Absolute: 0.4 10*3/uL (ref 0.1–1.0)
NEUTROS ABS: 2.8 10*3/uL (ref 1.4–7.7)
NEUTROS PCT: 49.6 % (ref 43.0–77.0)
PLATELETS: 196 10*3/uL (ref 150.0–400.0)
RBC: 5.21 Mil/uL (ref 4.22–5.81)
RDW: 13.3 % (ref 11.5–15.5)
WBC: 5.7 10*3/uL (ref 4.0–10.5)

## 2017-12-27 LAB — PSA: PSA: 0.49 ng/mL (ref 0.10–4.00)

## 2017-12-27 MED ORDER — CIPROFLOXACIN HCL 500 MG PO TABS: 500.0000 mg | ORAL_TABLET | Freq: Two times a day (BID) | ORAL | 0 refills | Status: DC

## 2017-12-27 MED ORDER — BETAMETHASONE DIPROPIONATE 0.05 % EX CREA: TOPICAL_CREAM | Freq: Two times a day (BID) | CUTANEOUS | 0 refills | Status: DC

## 2017-12-27 NOTE — Progress Notes (Signed)
Devin Moreno is a 37 y.o. male with the following history as recorded in EpicCare:  Patient Active Problem List   Diagnosis Date Noted  . Flu-like symptoms 07/16/2017  . Routine adult health maintenance 10/11/2016  . Hemorrhoid 10/11/2016    Current Outpatient Medications  Medication Sig Dispense Refill  . meclizine (ANTIVERT) 25 MG tablet Take 1 tablet (25 mg total) by mouth 3 (three) times daily as needed for dizziness. 30 tablet 0  . betamethasone dipropionate (DIPROLENE) 0.05 % cream Apply topically 2 (two) times daily. 45 g 0  . ciprofloxacin (CIPRO) 500 MG tablet Take 1 tablet (500 mg total) by mouth 2 (two) times daily. 20 tablet 0   No current facility-administered medications for this visit.     Allergies: Patient has no known allergies.  Past Medical History:  Diagnosis Date  . Hypertension     Past Surgical History:  Procedure Laterality Date  . WISDOM TOOTH EXTRACTION      Family History  Problem Relation Age of Onset  . Healthy Mother   . Hypertension Father   . Heart disease Father   . Tuberculosis Maternal Grandmother   . Prostate cancer Maternal Grandfather     Social History   Tobacco Use  . Smoking status: Former Smoker    Packs/day: 1.00    Years: 16.00    Pack years: 16.00    Types: Cigarettes  . Smokeless tobacco: Never Used  Substance Use Topics  . 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  Head: Normocephalic and atraumatic  Lungs: Respirations unlabored; clear to auscultation bilaterally without wheeze, rales, rhonchi  CVS exam: normal rate and regular rhythm.  Neurologic: Alert and oriented; speech intact; face symmetrical; moves all extremities well; CNII-XII intact without focal deficit   Assessment:  1. Dysuria   2. Prostatitis, unspecified prostatitis type   3. Lipid screening     Plan:  1. Check U/A today; suspect prostatitis; update CBC, CMP, PSA today; Rx for Cipro 500 mg bid x 10 days; 3. Update lipid panel today;   No follow-ups on file.  Orders Placed This Encounter  Procedures  . CBC w/Diff    Standing Status:   Future    Number of Occurrences:   1    Standing Expiration Date:   12/27/2018  . PSA    Standing Status:   Future    Number of Occurrences:   1    Standing Expiration Date:   12/27/2018  . Comp Met (CMET)    Standing Status:   Future  Number of Occurrences:   1    Standing Expiration Date:   12/27/2018  . Lipid panel    Standing Status:   Future    Number of Occurrences:   1    Standing Expiration Date:   12/28/2018  . POCT Urinalysis Dipstick (Automated)    Requested Prescriptions   Signed Prescriptions Disp Refills  . ciprofloxacin (CIPRO) 500 MG tablet 20 tablet 0    Sig: Take 1 tablet (500 mg total) by mouth 2 (two) times daily.  . betamethasone dipropionate (DIPROLENE) 0.05 % cream 45 g 0    Sig: Apply topically 2 (two) times daily.

## 2017-12-27 NOTE — Patient Instructions (Signed)
Prostatitis Prostatitis is swelling of the prostate gland. The prostate helps to make semen. It is below a man's bladder, in front of the rectum. There are different types of prostatitis. Follow these instructions at home:  Take over-the-counter and prescription medicines only as told by your doctor.  If you were prescribed an antibiotic medicine, take it as told by your doctor. Do not stop taking the antibiotic even if you start to feel better.  If your doctor prescribed exercises, do them as directed.  Take sitz baths as told by your doctor. To take a sitz bath, sit in warm water that is deep enough to cover your hips and butt.  Keep all follow-up visits as told by your doctor. This is important. Contact a doctor if:  Your symptoms get worse.  You have a fever. Get help right away if:  You have chills.  You feel sick to your stomach (nauseous).  You throw up (vomit).  You feel light-headed.  You feel like you might pass out (faint).  You cannot pee (urinate).  You have blood or clumps of blood (blood clots) in your pee (urine). This information is not intended to replace advice given to you by your health care provider. Make sure you discuss any questions you have with your health care provider. Document Released: 12/11/2011 Document Revised: 03/01/2016 Document Reviewed: 03/01/2016 Elsevier Interactive Patient Education  2017 [employer]  

## 2018-01-02 ENCOUNTER — Encounter: Payer: Self-pay | Admitting: Family

## 2018-01-02 ENCOUNTER — Other Ambulatory Visit: Payer: Self-pay | Admitting: Family

## 2018-01-02 DIAGNOSIS — R109 Unspecified abdominal pain: Secondary | ICD-10-CM

## 2018-01-03 ENCOUNTER — Ambulatory Visit (INDEPENDENT_AMBULATORY_CARE_PROVIDER_SITE_OTHER)
Admission: RE | Admit: 2018-01-03 | Discharge: 2018-01-03 | Disposition: A | Discharge status: Home / Self Care | Payer: BC Managed Care – PPO | Source: Ambulatory Visit | Attending: Family | Admitting: Family

## 2018-01-03 ENCOUNTER — Other Ambulatory Visit: Payer: Self-pay | Admitting: Family

## 2018-01-03 DIAGNOSIS — K7689 Other specified diseases of liver: Secondary | ICD-10-CM

## 2018-01-03 DIAGNOSIS — R109 Unspecified abdominal pain: Secondary | ICD-10-CM | POA: Diagnosis not present

## 2018-01-03 MED ORDER — SULFAMETHOXAZOLE-TRIMETHOPRIM 800-160 MG PO TABS: 1.0000 | ORAL_TABLET | Freq: Two times a day (BID) | ORAL | 0 refills | Status: DC

## 2018-01-11 ENCOUNTER — Encounter: Payer: Self-pay | Admitting: Family

## 2018-01-13 ENCOUNTER — Encounter: Payer: Self-pay | Admitting: Family

## 2018-01-13 ENCOUNTER — Other Ambulatory Visit: Payer: Self-pay | Admitting: Family

## 2018-01-13 DIAGNOSIS — R339 Retention of urine, unspecified: Secondary | ICD-10-CM

## 2018-01-13 MED ORDER — TAMSULOSIN HCL 0.4 MG PO CAPS: 0.4000 mg | ORAL_CAPSULE | Freq: Every day | ORAL | 3 refills | Status: DC

## 2018-01-13 NOTE — Telephone Encounter (Signed)
FYI

## 2018-01-15 ENCOUNTER — Ambulatory Visit
Admission: RE | Admit: 2018-01-15 | Discharge: 2018-01-15 | Disposition: A | Discharge status: Home / Self Care | Payer: BC Managed Care – PPO | Source: Ambulatory Visit | Attending: Family | Admitting: Family

## 2018-01-15 DIAGNOSIS — K7689 Other specified diseases of liver: Secondary | ICD-10-CM

## 2018-07-18 ENCOUNTER — Encounter: Payer: Self-pay | Admitting: Family

## 2018-07-18 ENCOUNTER — Other Ambulatory Visit: Payer: Self-pay | Admitting: Family

## 2018-07-18 MED ORDER — MAGIC MOUTHWASH
5.0000 mL | Freq: Four times a day (QID) | ORAL | 0 refills | Status: DC | PRN
Start: 2018-07-18 — End: 2018-09-29

## 2018-07-21 ENCOUNTER — Ambulatory Visit: Payer: BC Managed Care – PPO | Admitting: Family

## 2018-07-21 ENCOUNTER — Encounter: Payer: Self-pay | Admitting: Family

## 2018-07-21 VITALS — BP 126/80 | HR 83 | Temp 98.6°F | Ht 70.0 in | Wt 229.0 lb

## 2018-07-21 DIAGNOSIS — K120 Recurrent oral aphthae: Secondary | ICD-10-CM | POA: Diagnosis not present

## 2018-07-21 NOTE — Progress Notes (Signed)
  Devin Moreno is a 38 y.o. male with the following history as recorded in EpicCare:  Patient Active Problem List   Diagnosis Date Noted  . Flu-like symptoms 07/16/2017  . Routine adult health maintenance 10/11/2016  . Hemorrhoid 10/11/2016    Current Outpatient Medications  Medication Sig Dispense Refill  . magic mouthwash SOLN Take 5 mLs by mouth 4 (four) times daily as needed for mouth pain. Gargle and spit for pain relief up to 4x per day; 115 mL 0  . nystatin (MYCOSTATIN) 100000 UNIT/ML suspension      No current facility-administered medications for this visit.     Allergies: Patient has no known allergies.  Past Medical History:  Diagnosis Date  . Hypertension     Past Surgical History:  Procedure Laterality Date  . WISDOM TOOTH EXTRACTION      Family History  Problem Relation Age of Onset  . Healthy Mother   . Hypertension Father   . Heart disease Father   . Tuberculosis Maternal Grandmother   . Prostate cancer Maternal Grandfather     Social History   Tobacco Use  . Smoking status: Former Smoker    Packs/day: 1.00    Years: 16.00    Pack years: 16.00    Types: Cigarettes  . Smokeless tobacco: Never Used  Substance Use Topics  . Alcohol use: Yes    Alcohol/week: 7.0 standard drinks    Types: 7 Cans of beer per week    Subjective:  Concern for "sores" in roof of mouth; had e-mailed in last week with symptoms/ picture- thought to be aphthous ulcer; was given Rx for Magic Mouthwash last weekend- does feel that symptoms are improved; very little pain- wanted to get ear/ mouth re-checked today;    Objective:  Vitals:   07/21/18 1555  BP: 126/80  Pulse: 83  Temp: 98.6 F (37 C)  TempSrc: Oral  SpO2: 96%  Weight: 229 lb (103.9 kg)  Height: 5\' 10"  (1.778 m)    General: Well developed, well nourished, in no acute distress  Skin : Warm and dry.  Head: Normocephalic and atraumatic  Eyes: Sclera and conjunctiva clear; pupils round and reactive to light;  extraocular movements intact  Ears: External normal; canals clear; tympanic membranes normal  Oropharynx: Pink, supple. No suspicious lesions; apthous ulcer noted in roof of mouth Neck: Supple without thyromegaly, adenopathy  Lungs: Respirations unlabored;  Neurologic: Alert and oriented; speech intact; face symmetrical; moves all extremities well; CNII-XII intact without focal deficit   Assessment:  1. Aphthous ulcer of mouth     Plan:  Reassurance; symptoms resolving; use Magic Mouthwash as needed; follow-up if area does not heal completely.   No follow-ups on file.  No orders of the defined types were placed in this encounter.   Requested Prescriptions    No prescriptions requested or ordered in this encounter

## 2018-09-22 ENCOUNTER — Encounter: Payer: Self-pay | Admitting: Family

## 2018-09-27 ENCOUNTER — Encounter: Payer: Self-pay | Admitting: Family

## 2018-09-29 ENCOUNTER — Ambulatory Visit (INDEPENDENT_AMBULATORY_CARE_PROVIDER_SITE_OTHER): Payer: BC Managed Care – PPO | Admitting: Internal Medicine

## 2018-09-29 ENCOUNTER — Other Ambulatory Visit: Payer: Self-pay | Admitting: Internal Medicine

## 2018-09-29 ENCOUNTER — Encounter: Payer: Self-pay | Admitting: Internal Medicine

## 2018-09-29 DIAGNOSIS — L237 Allergic contact dermatitis due to plants, except food: Secondary | ICD-10-CM | POA: Diagnosis not present

## 2018-09-29 MED ORDER — CLOBETASOL PROPIONATE 0.05 % EX OINT: 1.0000 "application " | TOPICAL_OINTMENT | Freq: Two times a day (BID) | CUTANEOUS | 1 refills | Status: DC

## 2018-09-29 MED ORDER — METHYLPREDNISOLONE 4 MG PO TBPK: ORAL_TABLET | ORAL | 0 refills | Status: DC

## 2018-09-29 MED ORDER — HYDROXYZINE HCL 25 MG PO TABS: 25.0000 mg | ORAL_TABLET | Freq: Three times a day (TID) | ORAL | 0 refills | Status: DC | PRN

## 2018-09-29 NOTE — Progress Notes (Signed)
Virtual Visit via Video Note  I connected with Devin Moreno on 09/29/18 at 10:30 AM EDT by a video enabled telemedicine application and verified that I am speaking with the correct person using two identifiers.   I discussed the limitations of evaluation and management by telemedicine and the availability of in person appointments. The patient expressed understanding and agreed to proceed.  History of Present Illness: The patient checked in today for a virtual visit.  He had sent an email this morning detailing a one-week history of intensely pruritic rash.  He says the rash started a day or 2 after he had helped some goats get out of a pond.  He was knowingly exposed to poison ivy.  He has had this reaction before.  He sent pictures for review.  He has tried to control the rash with a low potency topical steroid and Benadryl but has not gotten some improvement.  The rash has been located around his lips, face, anterior and posterior torso, abdomen, and lower extremities.  His maximum temperature has been 97.  He denies shortness of breath, wheezing, and swelling around his mouth/lip/throat/tongue.  There are no parts of the rash that have become painful, are draining pus, showing red streaks, or bleeding.    Observations/Objective: On the video monitor he was alert, oriented, and in no acute distress.  The pictures show macules and papules in linear formations scattered throughout the torso and lower extremities.  The lesions on his face have resolved.  Some of the lesions are excoriated but there are no vesicles, pustules, streaking, or exudates.   Assessment and Plan: He has a moderately severe case of contact dermatitis.  He is not getting symptom relief with a low potency topical steroid or Benadryl so I have asked him to upgrade to a 6-day course of steroids, a moderately high potency topical steroid ointment, and hydroxyzine as needed.   Follow Up Instructions: He will let me know if he  develops any new or worsening symptoms.  He agrees to be compliant with the listed medications.    I discussed the assessment and treatment plan with the patient. The patient was provided an opportunity to ask questions and all were answered. The patient agreed with the plan and demonstrated an understanding of the instructions.   The patient was advised to call back or seek an in-person evaluation if the symptoms worsen or if the condition fails to improve as anticipated.  I provided 25 minutes of non-face-to-face time during this encounter.   Sanda Linger, MD

## 2019-02-24 ENCOUNTER — Encounter: Payer: Self-pay | Admitting: Family

## 2019-02-24 ENCOUNTER — Other Ambulatory Visit: Payer: Self-pay

## 2019-02-24 DIAGNOSIS — Z20822 Contact with and (suspected) exposure to covid-19: Secondary | ICD-10-CM

## 2019-02-25 ENCOUNTER — Other Ambulatory Visit: Payer: Self-pay

## 2019-02-25 DIAGNOSIS — Z20822 Contact with and (suspected) exposure to covid-19: Secondary | ICD-10-CM

## 2019-02-26 LAB — NOVEL CORONAVIRUS, NAA: SARS-CoV-2, NAA: NOT DETECTED

## 2019-12-09 ENCOUNTER — Encounter: Payer: Self-pay | Admitting: Family

## 2019-12-10 ENCOUNTER — Telehealth (INDEPENDENT_AMBULATORY_CARE_PROVIDER_SITE_OTHER): Payer: BC Managed Care – PPO | Admitting: Internal Medicine

## 2019-12-10 ENCOUNTER — Encounter: Payer: Self-pay | Admitting: Internal Medicine

## 2019-12-10 DIAGNOSIS — K529 Noninfective gastroenteritis and colitis, unspecified: Secondary | ICD-10-CM | POA: Diagnosis not present

## 2019-12-10 NOTE — Progress Notes (Signed)
Virtual Visit via Video Note  I connected with Devin Moreno on 12/10/19 at  3:00 PM EDT by a video enabled telemedicine application and verified that I am speaking with the correct person using two identifiers.  The patient and the provider were at separate locations throughout the entire encounter. Patient location: home, Provider location: work   I discussed the limitations of evaluation and management by telemedicine and the availability of in person appointments. The patient expressed understanding and agreed to proceed. The patient and the provider were the only parties present for the visit unless noted in HPI below.  History of Present Illness: The patient is a 39 y.o. man with visit for diarrhea started Tuesday and headaches Wednesday. Fully vaccinated for covid-19 back end of March. Denies fevers or chills. About 2 weeks ago rest of family had GI bug with diarrhea but he did not catch at the time. Denies cough, SOB, taste/smell change. Has been drinking fluids and feeling overall better today. Mild nausea still and loose stools but less often. No more vomiting. Denies blood in vomit or diarrhea. Overall it is improving. Has tried pushing fluids  Observations/Objective: Appearance: normal, breathing appears normal, casual grooming, abdomen does not appear distended, throat normal, memory normal, mental status is A and O times 3  Assessment and Plan: See problem oriented charting  Follow Up Instructions: push fluids, BRAT diet, call back for worsening symptoms  I discussed the assessment and treatment plan with the patient. The patient was provided an opportunity to ask questions and all were answered. The patient agreed with the plan and demonstrated an understanding of the instructions.   The patient was advised to call back or seek an in-person evaluation if the symptoms worsen or if the condition fails to improve as anticipated.  Myrlene Broker, MD

## 2019-12-10 NOTE — Assessment & Plan Note (Signed)
Overall improving and likely viral. Advised to stay home from work while still having any diarrhea to avoid risk of spreading. Not consistent with covid-19.

## 2019-12-20 ENCOUNTER — Encounter: Payer: Self-pay | Admitting: Family

## 2020-01-25 ENCOUNTER — Encounter: Payer: Self-pay | Admitting: Internal Medicine

## 2020-01-25 ENCOUNTER — Other Ambulatory Visit: Payer: Self-pay

## 2020-01-25 ENCOUNTER — Ambulatory Visit (INDEPENDENT_AMBULATORY_CARE_PROVIDER_SITE_OTHER): Payer: BC Managed Care – PPO | Admitting: Internal Medicine

## 2020-01-25 VITALS — BP 138/92 | HR 72 | Temp 99.0°F | Resp 16 | Ht 70.0 in | Wt 231.6 lb

## 2020-01-25 DIAGNOSIS — E559 Vitamin D deficiency, unspecified: Secondary | ICD-10-CM | POA: Diagnosis not present

## 2020-01-25 DIAGNOSIS — R0789 Other chest pain: Secondary | ICD-10-CM

## 2020-01-25 DIAGNOSIS — I10 Essential (primary) hypertension: Secondary | ICD-10-CM | POA: Diagnosis not present

## 2020-01-25 DIAGNOSIS — Z0000 Encounter for general adult medical examination without abnormal findings: Secondary | ICD-10-CM

## 2020-01-25 DIAGNOSIS — G43009 Migraine without aura, not intractable, without status migrainosus: Secondary | ICD-10-CM | POA: Diagnosis not present

## 2020-01-25 LAB — TROPONIN I (HIGH SENSITIVITY): High Sens Troponin I: 4 ng/L (ref 2–17)

## 2020-01-25 MED ORDER — UBRELVY 100 MG PO TABS: 1.0000 | ORAL_TABLET | Freq: Every day | ORAL | 1 refills | Status: DC | PRN

## 2020-01-25 NOTE — Patient Instructions (Signed)

## 2020-01-25 NOTE — Progress Notes (Signed)
Subjective:  Patient ID: Devin Moreno, male    DOB: 1980/11/05  Age: 39 y.o. MRN: [id number]  CC: Hypertension, Annual Exam, and Headache  This visit occurred during the SARS-CoV-2 public health emergency.  Safety protocols were in place, including screening questions prior to the visit, additional usage of staff PPE, and extensive cleaning of exam room while observing appropriate contact time as indicated for disinfecting solutions.    HPI Devin Moreno presents for a CPX.  New to me.  He complains that he has a headache about once every week or 2.  He describes it as a stabbing pain in the posterior aspect of his head that responds minimally to Motrin.  Some of the headaches are so severe that he has to go home and may miss a day of work.  He has mild nausea with the headaches but never vomits.  He denies blurred vision, loss of hearing, paresthesias, dizziness, or lightheadedness.  He has been monitoring his blood pressure and based on a wrist band monitor his blood pressure has been elevated.  He had a recent episode of chest tightness at rest but denies shortness of breath, diaphoresis, edema, or fatigue.  Outpatient Medications Prior to Visit  Medication Sig Dispense Refill  . betamethasone dipropionate (DIPROLENE) 0.05 % ointment Apply topically 2 (two) times daily. (Patient not taking: Reported on 01/25/2020)    . hydrOXYzine (ATARAX/VISTARIL) 25 MG tablet Take 1 tablet (25 mg total) by mouth 3 (three) times daily as needed. (Patient not taking: Reported on 09/29/2018) 30 tablet 0   No facility-administered medications prior to visit.    ROS Review of Systems  Constitutional: Negative for chills, diaphoresis, fatigue and fever.  HENT: Negative.  Negative for trouble swallowing and voice change.   Eyes: Negative.  Negative for photophobia, pain and visual disturbance.  Respiratory: Positive for chest tightness. Negative for apnea, cough, shortness of breath and wheezing.     Cardiovascular: Negative for chest pain, palpitations and leg swelling.  Gastrointestinal: Positive for nausea. Negative for abdominal pain, constipation, diarrhea and vomiting.  Endocrine: Negative.   Genitourinary: Negative.  Negative for difficulty urinating.  Musculoskeletal: Negative for back pain and neck pain.  Skin: Negative.  Negative for color change and pallor.  Neurological: Positive for headaches. Negative for dizziness, speech difficulty, weakness and light-headedness.  Hematological: Negative for adenopathy. Does not bruise/bleed easily.  Psychiatric/Behavioral: Negative.     Objective:  BP (!) 138/92 (BP Location: Left Arm)   Pulse 72   Temp 99 F (37.2 C) (Oral)   Resp 16   Ht 5\' 10"  (1.778 m)   Wt 231 lb 9.6 oz (105.1 kg)   SpO2 95%   BMI 33.23 kg/m   BP Readings from Last 3 Encounters:  01/25/20 (!) 138/92  07/21/18 126/80  12/27/17 130/72    Wt Readings from Last 3 Encounters:  01/25/20 231 lb 9.6 oz (105.1 kg)  07/21/18 229 lb (103.9 kg)  12/27/17 216 lb (98 kg)    Physical Exam Vitals reviewed.  HENT:     Mouth/Throat:     Mouth: Mucous membranes are moist.  Eyes:     General: No scleral icterus.    Extraocular Movements: Extraocular movements intact.     Right eye: Normal extraocular motion and no nystagmus.     Left eye: Normal extraocular motion and no nystagmus.     Pupils: Pupils are equal, round, and reactive to light. Pupils are equal.  Neck:     Meningeal: Brudzinski's  sign absent.  Cardiovascular:     Rate and Rhythm: Normal rate and regular rhythm.     Heart sounds: No murmur heard.      Comments: EKG - NSR, 68 bpm No LVH Normal EKG Pulmonary:     Effort: Pulmonary effort is normal.     Breath sounds: No stridor. No wheezing, rhonchi or rales.  Abdominal:     General: Abdomen is flat.     Palpations: There is no mass.     Tenderness: There is no abdominal tenderness.  Musculoskeletal:     Cervical back: Normal range of  motion and neck supple. No rigidity.     Right lower leg: No edema.     Left lower leg: No edema.  Lymphadenopathy:     Cervical: No cervical adenopathy.  Skin:    General: Skin is warm and dry.     Coloration: Skin is not pale.  Neurological:     General: No focal deficit present.     Mental Status: He is alert and oriented to person, place, and time. Mental status is at baseline.     Cranial Nerves: No cranial nerve deficit.     Sensory: No sensory deficit.     Motor: No weakness.     Coordination: Coordination normal.     Gait: Gait normal.     Deep Tendon Reflexes: Reflexes normal.  Psychiatric:        Mood and Affect: Mood normal.        Behavior: Behavior normal.        Thought Content: Thought content normal.        Judgment: Judgment normal.     Lab Results  Component Value Date   WBC 6.4 01/25/2020   HGB 15.6 01/25/2020   HCT 46.8 01/25/2020   PLT 209 01/25/2020   GLUCOSE 89 01/25/2020   CHOL 197 01/25/2020   TRIG 206 (H) 01/25/2020   HDL 51 01/25/2020   LDLCALC 114 (H) 01/25/2020   ALT 23 01/25/2020   AST 15 01/25/2020   NA 140 01/25/2020   K 4.9 01/25/2020   CL 105 01/25/2020   CREATININE 1.14 01/25/2020   BUN 18 01/25/2020   CO2 30 01/25/2020   TSH 0.78 01/25/2020   PSA 0.49 12/27/2017    US Abdomen Limited RUQ  Result Date: 01/15/2018 CLINICAL DATA:  Follow-up presumed cyst on CT. EXAM: ULTRASOUND ABDOMEN LIMITED RIGHT UPPER QUADRANT COMPARISON:  01/03/2018 abdominal CT FINDINGS: Gallbladder: Echogenic, nonshadowing, anti dependent mural nodule measuring 7 mm. No gallstones or wall thickening. Common bile duct: Diameter: 4 mm Liver: Two simple appearing cysts are seen within the right lobe measuring up to 1 cm. No solid mass is noted. Normal parenchymal echogenicity. Portal vein is patent on color Doppler imaging with normal direction of blood flow towards the liver. IMPRESSION: 1. Small simple hepatic cysts.  Negative for solid liver mass. 2. 7 mm  gallbladder polyp. This meets size threshold for 1 year follow-up. Electronically Signed   By: Marnee Spring M.D.   On: 01/15/2018 13:54    Assessment & Plan:   Devin Moreno was seen today for hypertension, annual exam and headache.  Diagnoses and all orders for this visit:  Routine general medical examination at a health care facility- Exam completed, labs reviewed- He does not have an elevated ASCVD risk or so I did not recommend a statin for CV risk reduction., vaccines reviewed and updated, patient education material was given. -     Lipid  panel; Future -     Lipid panel  Sensation of chest tightness- His EKG is negative for ischemia.  His troponin is negative.  He has no other symptoms of ischemia.  I do not think a cardiac work-up is indicated at this time. -     Troponin I (High Sensitivity); Future -     EKG 12-Lead -     Troponin I (High Sensitivity)  Essential hypertension- He has stage I hypertension.  His labs are negative for secondary causes or endorgan damage.  His EKG is normal.  I do not think the headaches are related to hypertension because his headaches are very sporadic.  He is not willing to take an antihypertensive at this time. -     CBC with Differential/Platelet; Future -     BASIC METABOLIC PANEL WITH GFR; Future -     Hepatic function panel; Future -     TSH; Future -     Urinalysis, Routine w reflex microscopic; Future -     VITAMIN D 25 Hydroxy (Vit-D Deficiency, Fractures); Future -     VITAMIN D 25 Hydroxy (Vit-D Deficiency, Fractures) -     Urinalysis, Routine w reflex microscopic -     TSH -     Hepatic function panel -     BASIC METABOLIC PANEL WITH GFR -     CBC with Differential/Platelet  Migraine without aura and without status migrainosus, not intractable -     Ubrogepant (UBRELVY) 100 MG TABS; Take 1 tablet by mouth daily as needed.  Vitamin D deficiency disease -     Cholecalciferol 1.25 MG (50000 UT) capsule; Take 1 capsule (50,000 Units  total) by mouth once a week.   I have discontinued Coree Drummond's hydrOXYzine and betamethasone dipropionate. I am also having him start on Ubrelvy and Cholecalciferol.  Meds ordered this encounter  Medications  . Ubrogepant (UBRELVY) 100 MG TABS    Sig: Take 1 tablet by mouth daily as needed.    Dispense:  30 tablet    Refill:  1  . Cholecalciferol 1.25 MG (50000 UT) capsule    Sig: Take 1 capsule (50,000 Units total) by mouth once a week.    Dispense:  12 capsule    Refill:  0   In addition to time spent on CPE, I spent 50 minutes in preparing to see the patient by review of recent labs, imaging and procedures, obtaining and reviewing separately obtained history, communicating with the patient and family or caregiver, ordering medications, tests or procedures, and documenting clinical information in the EHR including the differential Dx, treatment, and any further evaluation and other management of 1. Sensation of chest tightness 2. Essential hypertension 3. Migraine without aura and without status migrainosus, not intractable 4.The. Vitamin D deficiency disease   Follow-up: Return in about 4 weeks (around 02/22/2020).  Sanda Linger, MD

## 2020-01-26 DIAGNOSIS — E559 Vitamin D deficiency, unspecified: Secondary | ICD-10-CM | POA: Insufficient documentation

## 2020-01-26 LAB — BASIC METABOLIC PANEL WITH GFR
BUN: 18 mg/dL (ref 7–25)
CO2: 30 mmol/L (ref 20–32)
Calcium: 10.1 mg/dL (ref 8.6–10.3)
Chloride: 105 mmol/L (ref 98–110)
Creat: 1.14 mg/dL (ref 0.60–1.35)
GFR, Est African American: 93 mL/min/{1.73_m2} (ref >=60)
GFR, Est Non African American: 80 mL/min/{1.73_m2} (ref >=60)
Glucose, Bld: 89 mg/dL (ref 65–99)
Potassium: 4.9 mmol/L (ref 3.5–5.3)
Sodium: 140 mmol/L (ref 135–146)

## 2020-01-26 LAB — CBC WITH DIFFERENTIAL/PLATELET
Absolute Monocytes: 461 cells/uL (ref 200–950)
Basophils Absolute: 19 cells/uL (ref 0–200)
Basophils Relative: 0.3 %
Eosinophils Absolute: 147 cells/uL (ref 15–500)
Eosinophils Relative: 2.3 %
HCT: 46.8 % (ref 38.5–50.0)
Hemoglobin: 15.6 g/dL (ref 13.2–17.1)
Lymphs Abs: 1952 cells/uL (ref 850–3900)
MCH: 29.2 pg (ref 27.0–33.0)
MCHC: 33.3 g/dL (ref 32.0–36.0)
MCV: 87.6 fL (ref 80.0–100.0)
MPV: 11.5 fL (ref 7.5–12.5)
Monocytes Relative: 7.2 %
Neutro Abs: 3821 cells/uL (ref 1500–7800)
Neutrophils Relative %: 59.7 %
Platelets: 209 10*3/uL (ref 140–400)
RBC: 5.34 10*6/uL (ref 4.20–5.80)
RDW: 12.9 % (ref 11.0–15.0)
Total Lymphocyte: 30.5 %
WBC: 6.4 10*3/uL (ref 3.8–10.8)

## 2020-01-26 LAB — LIPID PANEL
Cholesterol: 197 mg/dL (ref <200)
HDL: 51 mg/dL (ref >=40)
LDL Cholesterol (Calc): 114 mg/dL (calc) — ABNORMAL HIGH
Non-HDL Cholesterol (Calc): 146 mg/dL (calc) — ABNORMAL HIGH (ref <130)
Total CHOL/HDL Ratio: 3.9 (calc) (ref <5.0)
Triglycerides: 206 mg/dL — ABNORMAL HIGH (ref <150)

## 2020-01-26 LAB — HEPATIC FUNCTION PANEL
AG Ratio: 2.1 (calc) (ref 1.0–2.5)
ALT: 23 U/L (ref 9–46)
AST: 15 U/L (ref 10–40)
Albumin: 4.7 g/dL (ref 3.6–5.1)
Alkaline phosphatase (APISO): 40 U/L (ref 36–130)
Bilirubin, Direct: 0.2 mg/dL (ref 0.0–0.2)
Globulin: 2.2 g/dL (calc) (ref 1.9–3.7)
Indirect Bilirubin: 1 mg/dL (calc) (ref 0.2–1.2)
Total Bilirubin: 1.2 mg/dL (ref 0.2–1.2)
Total Protein: 6.9 g/dL (ref 6.1–8.1)

## 2020-01-26 LAB — URINALYSIS, ROUTINE W REFLEX MICROSCOPIC
Bilirubin Urine: NEGATIVE
Glucose, UA: NEGATIVE
Hgb urine dipstick: NEGATIVE
Leukocytes,Ua: NEGATIVE
Nitrite: NEGATIVE
Protein, ur: NEGATIVE
Specific Gravity, Urine: 1.02 (ref 1.001–1.03)
pH: 6.5 (ref 5.0–8.0)

## 2020-01-26 LAB — TSH: TSH: 0.78 mIU/L (ref 0.40–4.50)

## 2020-01-26 LAB — VITAMIN D 25 HYDROXY (VIT D DEFICIENCY, FRACTURES): Vit D, 25-Hydroxy: 17 ng/mL — ABNORMAL LOW (ref 30–100)

## 2020-01-26 MED ORDER — CHOLECALCIFEROL 1.25 MG (50000 UT) PO CAPS: 50000.0000 [IU] | ORAL_CAPSULE | ORAL | 0 refills | Status: DC

## 2020-01-28 ENCOUNTER — Other Ambulatory Visit: Payer: Self-pay | Admitting: Internal Medicine

## 2020-01-28 ENCOUNTER — Encounter: Payer: Self-pay | Admitting: Internal Medicine

## 2020-01-28 DIAGNOSIS — F411 Generalized anxiety disorder: Secondary | ICD-10-CM | POA: Insufficient documentation

## 2020-01-28 DIAGNOSIS — I10 Essential (primary) hypertension: Secondary | ICD-10-CM

## 2020-01-28 MED ORDER — IRBESARTAN 150 MG PO TABS: 150.0000 mg | ORAL_TABLET | Freq: Every day | ORAL | 0 refills | Status: DC

## 2020-01-28 MED ORDER — CLONAZEPAM 1 MG PO TABS: 1.0000 mg | ORAL_TABLET | Freq: Two times a day (BID) | ORAL | 1 refills | Status: DC | PRN

## 2020-02-01 ENCOUNTER — Telehealth: Payer: Self-pay

## 2020-02-01 NOTE — Telephone Encounter (Signed)
Key: [id number] 

## 2020-02-17 ENCOUNTER — Ambulatory Visit: Payer: BC Managed Care – PPO | Admitting: Internal Medicine

## 2020-02-17 ENCOUNTER — Other Ambulatory Visit: Payer: Self-pay

## 2020-02-17 ENCOUNTER — Encounter: Payer: Self-pay | Admitting: Internal Medicine

## 2020-02-17 VITALS — BP 130/88 | HR 98 | Temp 98.0°F | Ht 70.0 in | Wt 234.0 lb

## 2020-02-17 DIAGNOSIS — I10 Essential (primary) hypertension: Secondary | ICD-10-CM

## 2020-02-17 DIAGNOSIS — F411 Generalized anxiety disorder: Secondary | ICD-10-CM

## 2020-02-17 MED ORDER — ESCITALOPRAM OXALATE 5 MG PO TABS: 5.0000 mg | ORAL_TABLET | Freq: Every day | ORAL | 0 refills | Status: DC

## 2020-02-17 NOTE — Progress Notes (Signed)
Subjective:  Patient ID: Devin Moreno, male    DOB: 1980-12-10  Age: 39 y.o. MRN: [id number]  CC: Hypertension  This visit occurred during the SARS-CoV-2 public health emergency.  Safety protocols were in place, including screening questions prior to the visit, additional usage of staff PPE, and extensive cleaning of exam room while observing appropriate contact time as indicated for disinfecting solutions.    HPI Devin Moreno presents for f/up - He tells me his blood pressure has been well controlled since he started taking the ARB.  He continues to complain of anxiety and anhedonia.  He tells me he is taking clonazepam but it causes sedation.  He tells me about 20 years ago he took Paxil and have a good response to it.  He denies SI, HI, or feeling worthless, helpless, or hopeless.  Outpatient Medications Prior to Visit  Medication Sig Dispense Refill  . irbesartan (AVAPRO) 150 MG tablet Take 1 tablet (150 mg total) by mouth daily. 90 tablet 0  . Cholecalciferol 1.25 MG (50000 UT) capsule Take 1 capsule (50,000 Units total) by mouth once a week. (Patient not taking: Reported on 02/17/2020) 12 capsule 0  . clonazePAM (KLONOPIN) 1 MG tablet Take 1 tablet (1 mg total) by mouth 2 (two) times daily as needed for anxiety. (Patient not taking: Reported on 02/17/2020) 60 tablet 1  . Ubrogepant (UBRELVY) 100 MG TABS Take 1 tablet by mouth daily as needed. (Patient not taking: Reported on 02/17/2020) 30 tablet 1   No facility-administered medications prior to visit.    ROS Review of Systems  Constitutional: Negative for appetite change, diaphoresis, fatigue and unexpected weight change.  HENT: Negative.   Eyes: Negative for visual disturbance.  Respiratory: Negative for cough, chest tightness, shortness of breath and wheezing.   Cardiovascular: Negative for chest pain, palpitations and leg swelling.  Gastrointestinal: Negative for abdominal pain, constipation, diarrhea, nausea and  vomiting.  Endocrine: Negative.   Genitourinary: Negative.  Negative for difficulty urinating, dysuria and urgency.  Musculoskeletal: Negative for arthralgias and myalgias.  Skin: Negative.  Negative for color change and pallor.  Hematological: Negative for adenopathy. Does not bruise/bleed easily.  Psychiatric/Behavioral: Positive for dysphoric mood and sleep disturbance. Negative for behavioral problems, confusion, self-injury and suicidal ideas. The patient is nervous/anxious.     Objective:  BP 130/88 (BP Location: Left Arm, Patient Position: Sitting, Cuff Size: Large)   Pulse 98   Temp 98 F (36.7 C) (Oral)   Ht 5\' 10"  (1.778 m)   Wt 234 lb (106.1 kg)   SpO2 95%   BMI 33.58 kg/m   BP Readings from Last 3 Encounters:  02/17/20 130/88  01/25/20 (!) 138/92  07/21/18 126/80    Wt Readings from Last 3 Encounters:  02/17/20 234 lb (106.1 kg)  01/25/20 231 lb 9.6 oz (105.1 kg)  07/21/18 229 lb (103.9 kg)    Physical Exam Vitals reviewed.  Constitutional:      Appearance: Normal appearance.  HENT:     Nose: Nose normal.     Mouth/Throat:     Mouth: Mucous membranes are moist.  Eyes:     General: No scleral icterus.    Conjunctiva/sclera: Conjunctivae normal.  Cardiovascular:     Rate and Rhythm: Normal rate and regular rhythm.     Heart sounds: No murmur heard.   Pulmonary:     Effort: Pulmonary effort is normal.     Breath sounds: No stridor. No wheezing, rhonchi or rales.  Abdominal:  General: Abdomen is flat. Bowel sounds are normal. There is no distension.     Palpations: Abdomen is soft. There is no fluid wave, hepatomegaly or mass.     Tenderness: There is no abdominal tenderness.  Musculoskeletal:        General: Normal range of motion.     Cervical back: Neck supple.     Right lower leg: No edema.     Left lower leg: No edema.  Lymphadenopathy:     Cervical: No cervical adenopathy.  Skin:    General: Skin is warm and dry.     Coloration: Skin is  not pale.  Neurological:     General: No focal deficit present.     Mental Status: He is alert.  Psychiatric:        Mood and Affect: Mood normal.        Behavior: Behavior normal.     Lab Results  Component Value Date   WBC 6.4 01/25/2020   HGB 15.6 01/25/2020   HCT 46.8 01/25/2020   PLT 209 01/25/2020   GLUCOSE 89 01/25/2020   CHOL 197 01/25/2020   TRIG 206 (H) 01/25/2020   HDL 51 01/25/2020   LDLCALC 114 (H) 01/25/2020   ALT 23 01/25/2020   AST 15 01/25/2020   NA 140 01/25/2020   K 4.9 01/25/2020   CL 105 01/25/2020   CREATININE 1.14 01/25/2020   BUN 18 01/25/2020   CO2 30 01/25/2020   TSH 0.78 01/25/2020   PSA 0.49 12/27/2017    US Abdomen Limited RUQ  Result Date: 01/15/2018 CLINICAL DATA:  Follow-up presumed cyst on CT. EXAM: ULTRASOUND ABDOMEN LIMITED RIGHT UPPER QUADRANT COMPARISON:  01/03/2018 abdominal CT FINDINGS: Gallbladder: Echogenic, nonshadowing, anti dependent mural nodule measuring 7 mm. No gallstones or wall thickening. Common bile duct: Diameter: 4 mm Liver: Two simple appearing cysts are seen within the right lobe measuring up to 1 cm. No solid mass is noted. Normal parenchymal echogenicity. Portal vein is patent on color Doppler imaging with normal direction of blood flow towards the liver. IMPRESSION: 1. Small simple hepatic cysts.  Negative for solid liver mass. 2. 7 mm gallbladder polyp. This meets size threshold for 1 year follow-up. Electronically Signed   By: Marnee Spring M.D.   On: 01/15/2018 13:54    Assessment & Plan:   Devin Moreno was seen today for hypertension.  Diagnoses and all orders for this visit:  GAD (generalized anxiety disorder)- I recommended that he start taking Escitalopram.  Will gradually increase the dose over the next few months. -     escitalopram (LEXAPRO) 5 MG tablet; Take 1 tablet (5 mg total) by mouth daily.  Essential hypertension- His blood pressure is adequately well controlled.  Will continue the current dose of  the ARB.   I am having Devin Moreno start on escitalopram. I am also having him maintain his Ubrelvy, Cholecalciferol, irbesartan, and clonazePAM.  Meds ordered this encounter  Medications  . escitalopram (LEXAPRO) 5 MG tablet    Sig: Take 1 tablet (5 mg total) by mouth daily.    Dispense:  30 tablet    Refill:  0     Follow-up: Return in about 6 months (around 08/19/2020).  Sanda Linger, MD

## 2020-02-17 NOTE — Patient Instructions (Signed)

## 2020-03-12 ENCOUNTER — Other Ambulatory Visit: Payer: Self-pay | Admitting: Internal Medicine

## 2020-03-12 DIAGNOSIS — F411 Generalized anxiety disorder: Secondary | ICD-10-CM

## 2020-04-14 ENCOUNTER — Other Ambulatory Visit: Payer: Self-pay | Admitting: Internal Medicine

## 2020-04-14 DIAGNOSIS — E559 Vitamin D deficiency, unspecified: Secondary | ICD-10-CM

## 2020-04-20 ENCOUNTER — Other Ambulatory Visit: Payer: Self-pay | Admitting: Internal Medicine

## 2020-04-20 DIAGNOSIS — I10 Essential (primary) hypertension: Secondary | ICD-10-CM

## 2020-05-25 ENCOUNTER — Encounter: Payer: Self-pay | Admitting: Family

## 2020-05-25 ENCOUNTER — Other Ambulatory Visit: Payer: Self-pay | Admitting: Internal Medicine

## 2020-05-25 DIAGNOSIS — I10 Essential (primary) hypertension: Secondary | ICD-10-CM

## 2020-05-25 MED ORDER — IRBESARTAN 150 MG PO TABS: 150.0000 mg | ORAL_TABLET | Freq: Every day | ORAL | 0 refills | Status: DC

## 2020-06-21 ENCOUNTER — Other Ambulatory Visit: Payer: BC Managed Care – PPO

## 2020-06-21 DIAGNOSIS — Z20822 Contact with and (suspected) exposure to covid-19: Secondary | ICD-10-CM

## 2020-06-22 LAB — NOVEL CORONAVIRUS, NAA: SARS-CoV-2, NAA: NOT DETECTED

## 2020-06-22 LAB — SARS-COV-2, NAA 2 DAY TAT

## 2020-07-06 ENCOUNTER — Ambulatory Visit: Payer: BC Managed Care – PPO | Admitting: Internal Medicine

## 2020-07-18 ENCOUNTER — Ambulatory Visit (INDEPENDENT_AMBULATORY_CARE_PROVIDER_SITE_OTHER): Payer: BC Managed Care – PPO

## 2020-07-18 ENCOUNTER — Ambulatory Visit (INDEPENDENT_AMBULATORY_CARE_PROVIDER_SITE_OTHER): Payer: BC Managed Care – PPO | Admitting: Internal Medicine

## 2020-07-18 ENCOUNTER — Encounter: Payer: Self-pay | Admitting: Internal Medicine

## 2020-07-18 ENCOUNTER — Other Ambulatory Visit: Payer: Self-pay

## 2020-07-18 VITALS — BP 136/82 | HR 80 | Temp 98.4°F | Resp 16 | Ht 70.0 in | Wt 235.0 lb

## 2020-07-18 DIAGNOSIS — Z23 Encounter for immunization: Secondary | ICD-10-CM

## 2020-07-18 DIAGNOSIS — R059 Cough, unspecified: Secondary | ICD-10-CM | POA: Diagnosis not present

## 2020-07-18 DIAGNOSIS — I10 Essential (primary) hypertension: Secondary | ICD-10-CM | POA: Diagnosis not present

## 2020-07-18 DIAGNOSIS — J208 Acute bronchitis due to other specified organisms: Secondary | ICD-10-CM

## 2020-07-18 DIAGNOSIS — U071 COVID-19: Secondary | ICD-10-CM

## 2020-07-18 HISTORY — DX: Cough, unspecified: R05.9

## 2020-07-18 LAB — BASIC METABOLIC PANEL
BUN: 14 mg/dL (ref 6–23)
CO2: 32 mEq/L (ref 19–32)
Calcium: 10.1 mg/dL (ref 8.4–10.5)
Chloride: 101 mEq/L (ref 96–112)
Creatinine, Ser: 1.02 mg/dL (ref 0.40–1.50)
GFR: 91.4 mL/min (ref >60.00)
Glucose, Bld: 114 mg/dL — ABNORMAL HIGH (ref 70–99)
Potassium: 3.6 mEq/L (ref 3.5–5.1)
Sodium: 140 mEq/L (ref 135–145)

## 2020-07-18 LAB — CBC WITH DIFFERENTIAL/PLATELET
Basophils Absolute: 0 10*3/uL (ref 0.0–0.1)
Basophils Relative: 0.2 % (ref 0.0–3.0)
Eosinophils Absolute: 0.2 10*3/uL (ref 0.0–0.7)
Eosinophils Relative: 2.3 % (ref 0.0–5.0)
HCT: 47.5 % (ref 39.0–52.0)
Hemoglobin: 16 g/dL (ref 13.0–17.0)
Lymphocytes Relative: 25 % (ref 12.0–46.0)
Lymphs Abs: 1.7 10*3/uL (ref 0.7–4.0)
MCHC: 33.8 g/dL (ref 30.0–36.0)
MCV: 85.3 fl (ref 78.0–100.0)
Monocytes Absolute: 0.5 10*3/uL (ref 0.1–1.0)
Monocytes Relative: 6.8 % (ref 3.0–12.0)
Neutro Abs: 4.5 10*3/uL (ref 1.4–7.7)
Neutrophils Relative %: 65.7 % (ref 43.0–77.0)
Platelets: 221 10*3/uL (ref 150.0–400.0)
RBC: 5.57 Mil/uL (ref 4.22–5.81)
RDW: 13.1 % (ref 11.5–15.5)
WBC: 6.9 10*3/uL (ref 4.0–10.5)

## 2020-07-18 LAB — SARS-COV-2 IGG: SARS-COV-2 IgG: 38.18

## 2020-07-18 NOTE — Patient Instructions (Signed)

## 2020-07-18 NOTE — Progress Notes (Signed)
Subjective:  Patient ID: Devin Moreno, male    DOB: 11-28-1980  Age: 40 y.o. MRN: [id number]  CC: Cough and Hypertension  This visit occurred during the SARS-CoV-2 public health emergency.  Safety protocols were in place, including screening questions prior to the visit, additional usage of staff PPE, and extensive cleaning of exam room while observing appropriate contact time as indicated for disinfecting solutions.    HPI Devin Moreno presents for f/up -  He was exposed to Covid about a month ago and since then he has had a mild, intermittent nonproductive cough.  He denies fever, chills, night sweats, chest pain, or hemoptysis.  Outpatient Medications Prior to Visit  Medication Sig Dispense Refill  . amphetamine-dextroamphetamine (ADDERALL) 20 MG tablet Take 20 mg by mouth daily.    . irbesartan (AVAPRO) 150 MG tablet Take 1 tablet (150 mg total) by mouth daily. 90 tablet 0  . Ubrogepant (UBRELVY) 100 MG TABS Take 1 tablet by mouth daily as needed. 30 tablet 1  . Cholecalciferol (VITAMIN D3) 1.25 MG (50000 UT) CAPS TAKE 1 CAPSULE BY MOUTH ONE TIME PER WEEK 12 capsule 0  . clonazePAM (KLONOPIN) 1 MG tablet Take 1 tablet (1 mg total) by mouth 2 (two) times daily as needed for anxiety. 60 tablet 1  . escitalopram (LEXAPRO) 5 MG tablet TAKE 1 TABLET BY MOUTH EVERY DAY 90 tablet 1   No facility-administered medications prior to visit.    ROS Review of Systems  Constitutional: Negative for appetite change, chills, diaphoresis, fatigue and fever.  HENT: Negative.  Negative for sore throat.   Eyes: Negative for visual disturbance.  Respiratory: Positive for cough. Negative for choking, shortness of breath and wheezing.   Cardiovascular: Negative for chest pain, palpitations and leg swelling.  Gastrointestinal: Negative for abdominal pain, constipation, diarrhea, nausea and vomiting.  Genitourinary: Negative.  Negative for difficulty urinating.  Musculoskeletal: Negative for  arthralgias, back pain and myalgias.  Skin: Negative for color change, pallor and rash.  Neurological: Negative.  Negative for dizziness and weakness.  Hematological: Negative for adenopathy. Does not bruise/bleed easily.  Psychiatric/Behavioral: Negative.     Objective:  BP 136/82   Pulse 80   Temp 98.4 F (36.9 C) (Oral)   Resp 16   Ht 5\' 10"  (1.778 m)   Wt 235 lb (106.6 kg)   SpO2 97%   BMI 33.72 kg/m   BP Readings from Last 3 Encounters:  07/18/20 136/82  02/17/20 130/88  01/25/20 (!) 138/92    Wt Readings from Last 3 Encounters:  07/18/20 235 lb (106.6 kg)  02/17/20 234 lb (106.1 kg)  01/25/20 231 lb 9.6 oz (105.1 kg)    Physical Exam Vitals reviewed.  HENT:     Nose: Nose normal.     Mouth/Throat:     Mouth: Mucous membranes are moist.  Eyes:     General: No scleral icterus.    Conjunctiva/sclera: Conjunctivae normal.  Cardiovascular:     Rate and Rhythm: Normal rate and regular rhythm.     Heart sounds: No murmur heard.   Pulmonary:     Effort: Pulmonary effort is normal.     Breath sounds: No stridor. No wheezing, rhonchi or rales.  Abdominal:     General: Abdomen is flat.     Palpations: There is no mass.     Tenderness: There is no abdominal tenderness. There is no guarding.  Musculoskeletal:        General: Normal range of motion.  Cervical back: Neck supple.     Right lower leg: No edema.     Left lower leg: No edema.  Lymphadenopathy:     Cervical: No cervical adenopathy.  Skin:    General: Skin is warm and dry.     Coloration: Skin is not pale.  Neurological:     General: No focal deficit present.     Mental Status: He is alert.     Lab Results  Component Value Date   WBC 6.9 07/18/2020   HGB 16.0 07/18/2020   HCT 47.5 07/18/2020   PLT 221.0 07/18/2020   GLUCOSE 114 (H) 07/18/2020   CHOL 197 01/25/2020   TRIG 206 (H) 01/25/2020   HDL 51 01/25/2020   LDLCALC 114 (H) 01/25/2020   ALT 23 01/25/2020   AST 15 01/25/2020   NA  140 07/18/2020   K 3.6 07/18/2020   CL 101 07/18/2020   CREATININE 1.02 07/18/2020   BUN 14 07/18/2020   CO2 32 07/18/2020   TSH 0.78 01/25/2020   PSA 0.49 12/27/2017    US Abdomen Limited RUQ  Result Date: 01/15/2018 CLINICAL DATA:  Follow-up presumed cyst on CT. EXAM: ULTRASOUND ABDOMEN LIMITED RIGHT UPPER QUADRANT COMPARISON:  01/03/2018 abdominal CT FINDINGS: Gallbladder: Echogenic, nonshadowing, anti dependent mural nodule measuring 7 mm. No gallstones or wall thickening. Common bile duct: Diameter: 4 mm Liver: Two simple appearing cysts are seen within the right lobe measuring up to 1 cm. No solid mass is noted. Normal parenchymal echogenicity. Portal vein is patent on color Doppler imaging with normal direction of blood flow towards the liver. IMPRESSION: 1. Small simple hepatic cysts.  Negative for solid liver mass. 2. 7 mm gallbladder polyp. This meets size threshold for 1 year follow-up. Electronically Signed   By: Marnee Spring M.D.   On: 01/15/2018 13:54   DG Chest 2 View  Result Date: 07/18/2020 CLINICAL DATA:  Persistent cough for 1 month. Previous smoker. History of hypertension. EXAM: CHEST - 2 VIEW COMPARISON:  None. FINDINGS: Mild hyperinflation. Normal heart size and pulmonary vascularity. No focal airspace disease or consolidation in the lungs. No blunting of costophrenic angles. No pneumothorax. Mediastinal contours appear intact. Degenerative changes in the spine. IMPRESSION: No active cardiopulmonary disease. Electronically Signed   By: Burman Nieves M.D.   On: 07/18/2020 21:21    Assessment & Plan:   [name] was seen today for cough and hypertension.  Diagnoses and all orders for this visit:  Cough- Based on his symptoms, exam, x-ray, and positive Covid antibody I think he has a resolving COVID-19 bronchitis.  No treatment is indicated. -     SARS-COV-2 IgG; Future -     DG Chest 2 View; Future -     SARS-COV-2 IgG  Essential hypertension-his blood pressure  is adequately well controlled. -     Basic metabolic panel; Future -     CBC with Differential/Platelet; Future -     Basic metabolic panel -     CBC with Differential/Platelet  Acute bronchitis due to COVID-19 virus- See above.  Other orders -     Tdap vaccine greater than or equal to 7yo IM -     Flu Vaccine QUAD 6+ mos PF IM (Fluarix Quad PF)   I have discontinued Sarp Wilbon's clonazePAM, escitalopram, and Vitamin D3. I am also having him maintain his Ubrelvy, irbesartan, and amphetamine-dextroamphetamine.  No orders of the defined types were placed in this encounter.    Follow-up: Return if symptoms worsen  or fail to improve.  Scarlette Calico, MD

## 2020-07-19 ENCOUNTER — Encounter: Payer: Self-pay | Admitting: Internal Medicine

## 2020-07-19 DIAGNOSIS — U071 COVID-19: Secondary | ICD-10-CM | POA: Insufficient documentation

## 2020-07-19 DIAGNOSIS — J208 Acute bronchitis due to other specified organisms: Secondary | ICD-10-CM

## 2020-07-19 HISTORY — DX: Acute bronchitis due to other specified organisms: J20.8

## 2020-10-25 NOTE — Progress Notes (Signed)
Subjective:    CC: B foot pain  I, Molly Weber, LAT, ATC, am serving as scribe for Dr. Clementeen Graham.  HPI: Pt is a 40 y/o male presenting w/ c/o B chronic foot pain, R>L.  He locates his pain to his navicular and 5th MT.  He has tried some arch supports in the past that hurt worse.  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  He denies any injury.  Radiating pain: yes intermittently into his calves Foot swelling: No Aggravating factors: prolonged standing/walking/weight-bearing; going downstairs Treatments tried: shoe inserts; ice  Pertinent review of Systems: No fevers or chills  Relevant historical information: Hypertension   Objective:    Vitals:   10/26/20 1006  BP: 110/78  Pulse: 90  SpO2: 96%   General: Well Developed, well nourished, and in no acute distress.   MSK: Right foot significant pes planus. Normal foot motion. Tender palpation at navicular prominence.  Tender lateral foot at fifth metatarsal. Intact strength. Stable ligamentous exam.  Left foot and ankle normal-appearing nontender normal motion.  Lab and Radiology Results  X-ray images right foot obtained today personally and independently interpreted Mild degenerative changes right foot.  No acute fractures. Await formal radiology review  Diagnostic Limited MSK Ultrasound of: Right foot and ankle Intact posterior tibialis tendon. Slight avulsion fragment at navicular prominence consistent with insertional tenosynovitis of posterior tibialis tendon onto the navicular. No tendon tears visible. Foot and ankle otherwise normal-appearing Impression: Insertional posterior tibialis tendinitis   Impression and Recommendations:    Assessment and Plan: 40 y.o. male with right foot pain predominantly medial.  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  Plan for arch straps/compression ankle sleeve, eccentric exercises, referral to physical therapy, and Voltaren gel.  Arch support with comfortable insoles would be helpful.  He is tried some himself so Fleet feet would be a good starting point to try a variety of different insoles that may be a little more comfortable.  Recheck in 1 month.Marland Kitchen  PDMP not reviewed this encounter. Orders Placed This Encounter  Procedures  . [place] LIMITED JOINT SPACE STRUCTURES LOW RIGHT(NO LINKED CHARGES)    Order Specific Question:   Reason for Exam (SYMPTOM  OR DIAGNOSIS REQUIRED)    Answer:   rt foot    Order Specific Question:   Preferred imaging location?    Answer:   Adult [facility]  . DG Foot Complete Right    Standing Status:   Future    Number of Occurrences:   1    Standing Expiration Date:   10/26/2021    Order Specific Question:   Reason for Exam (SYMPTOM  OR DIAGNOSIS REQUIRED)    Answer:   eval foot pain    Order Specific Question:   Preferred imaging location?    Answer:   [facility]  . Ambulatory referral to Physical Therapy    Referral Priority:   Routine    Referral Type:   Physical Medicine    Referral Reason:   Specialty Services Required    Requested Specialty:   Physical Therapy   No orders of the defined types were placed in this encounter.   Discussed warning signs or symptoms. Please see discharge instructions. Patient expresses understanding.   The above documentation has been reviewed and is accurate  and complete Lynne Leader, M.D.

## 2020-10-26 ENCOUNTER — Ambulatory Visit: Payer: BC Managed Care – PPO | Admitting: Family Medicine

## 2020-10-26 ENCOUNTER — Ambulatory Visit: Payer: Self-pay

## 2020-10-26 ENCOUNTER — Ambulatory Visit (INDEPENDENT_AMBULATORY_CARE_PROVIDER_SITE_OTHER): Payer: BC Managed Care – PPO

## 2020-10-26 ENCOUNTER — Other Ambulatory Visit: Payer: Self-pay

## 2020-10-26 ENCOUNTER — Encounter: Payer: Self-pay | Admitting: Family Medicine

## 2020-10-26 VITALS — BP 110/78 | HR 90 | Ht 70.0 in | Wt 243.3 lb

## 2020-10-26 DIAGNOSIS — M76821 Posterior tibial tendinitis, right leg: Secondary | ICD-10-CM | POA: Diagnosis not present

## 2020-10-26 DIAGNOSIS — M76822 Posterior tibial tendinitis, left leg: Secondary | ICD-10-CM

## 2020-10-26 DIAGNOSIS — M79671 Pain in right foot: Secondary | ICD-10-CM

## 2020-10-26 NOTE — Patient Instructions (Addendum)
Thank you for coming in today.  Arch straps  Please use Voltaren gel (Generic Diclofenac Gel) up to 4x daily for pain as needed.  This is available over-the-counter as both the name brand Voltaren gel and the generic diclofenac gel.  Exercises we taught in clinic.   I've referred you to Physical Therapy.  Let us know if you don't hear from them in one week.  Please complete the exercises that the athletic trainer went over with you: View at [email or web address] using code: 6FQJC5G  I recommend you obtained a compression sleeve to help with your joint problems. There are many options on the market however I recommend obtaining a full ankle Body Helix compression sleeve.  You can find information (including how to appropriate measure yourself for sizing) can be found at www.Body [email or web address].ch.  Many of these products are health savings account (HSA) eligible.   You can use the compression sleeve at any time throughout the day but is most important to use while being active as well as for 2 hours post-activity.   It is appropriate to ice following activity with the compression sleeve in place.  Insoles with some degree of arch support.  Fleet feet may be able to help get you some insoles.   Please get an Xray today before you leave  Recheck in 1 month.

## 2020-10-28 NOTE — Progress Notes (Signed)
Right foot x-ray shows a little bit of arthritis at the big toe joint and a 2 mm object in the foot that does not appear to be metallic.  This could be a rock or a piece of glass embedded in the foot.  It small approximately 2 mm.  On the bottom of the foot.

## 2020-11-06 ENCOUNTER — Encounter: Payer: Self-pay | Admitting: Internal Medicine

## 2020-11-09 ENCOUNTER — Ambulatory Visit (INDEPENDENT_AMBULATORY_CARE_PROVIDER_SITE_OTHER): Payer: BC Managed Care – PPO | Admitting: Physical Therapy

## 2020-11-09 ENCOUNTER — Other Ambulatory Visit: Payer: Self-pay

## 2020-11-09 ENCOUNTER — Encounter: Payer: Self-pay | Admitting: Physical Therapy

## 2020-11-09 DIAGNOSIS — M25572 Pain in left ankle and joints of left foot: Secondary | ICD-10-CM

## 2020-11-09 DIAGNOSIS — M6281 Muscle weakness (generalized): Secondary | ICD-10-CM

## 2020-11-09 DIAGNOSIS — M25571 Pain in right ankle and joints of right foot: Secondary | ICD-10-CM

## 2020-11-09 NOTE — Therapy (Signed)
[facility] [address] [place], [place], [address] Phone: [phone]   Fax:  [phone]  Physical Therapy Evaluation  Patient Details  Name: Devin Moreno MRN: [id number] Date of Birth: 04-12-1981 Referring Provider (PT): Dr. Clementeen Graham   Encounter Date: 11/09/2020   PT End of Session - 11/09/20 1508    Visit Number 1    Number of Visits 6    Date for PT Re-Evaluation 12/21/20    Authorization Type BCBS [id number] copay    PT Start Time 1431    PT Stop Time 1505    PT Time Calculation (min) 34 min    Activity Tolerance Patient tolerated treatment well    Behavior During Therapy [facility] for tasks assessed/performed           Past Medical History:  Diagnosis Date  . Hypertension     Past Surgical History:  Procedure Laterality Date  . WISDOM TOOTH EXTRACTION      There were no vitals filed for this visit.    Subjective Assessment - 11/09/20 1433    Subjective Pt is a 40 y/o male who presents to OPPT for pain in bil feet, Rt worse than Lt x 2 years, with worsening symptoms over the past year.  Pain on the Rt foot mainly isolated to the navicular area.  Pt without known mechanism of injury.    Limitations Standing;Walking    How long can you stand comfortably? couple hours - pain following    Diagnostic tests posterior tib tendinopathy    Patient Stated Goals improve pain    Currently in Pain? 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 Clementeen Graham    Onset Date/Surgical Date --   chronic x 2 years   Hand Dominance Right    Next MD Visit 11/25/20    Prior Therapy n/a      Precautions   Precautions None      Restrictions   Weight Bearing Restrictions No      Balance Screen   Has the patient fallen in the past 6 months No    Has the patient had a decrease in activity level because of a fear of falling?  No    Is the patient reluctant to leave their home because of a fear of falling?  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    Leisure video games, movies      Cognition   Overall Cognitive Status Within Functional Limits for tasks assessed      Observation/Other Assessments   Observations overpronation and flat feet noted bil    Focus on Therapeutic Outcomes (FOTO)  59 (predicted 65)      ROM / Strength   AROM / PROM / Strength AROM;Strength      AROM   AROM Assessment Site Ankle    Right/Left Ankle Right;Left    Right Ankle Dorsiflexion 5    Right Ankle Plantar Flexion 55    Right Ankle Inversion 25    Right Ankle Eversion 17    Left Ankle Dorsiflexion 5    Left Ankle Plantar Flexion 54    Left Ankle Inversion 27    Left Ankle Eversion 18      Strength   Strength Assessment Site Ankle    Right/Left Ankle Right;Left    Right Ankle Dorsiflexion 5/5    Right Ankle Plantar Flexion 3+/5    Right Ankle Inversion 3+/5   with pain - giveway weakness   Right Ankle Eversion 5/5    Left Ankle Dorsiflexion 5/5    Left  Ankle Plantar Flexion 5/5    Left Ankle Inversion 5/5    Left Ankle Eversion 5/5      Palpation   Palpation comment tenderness on Rt foot at posterior tib insertion                      Objective measurements completed on examination: See above findings.       [facility] Adult PT Treatment/Exercise - 11/09/20 0001      Exercises   Exercises Other Exercises    Other Exercises  see pt instructions - pt performed 1-2 reps of each exercise      Modalities   Modalities Iontophoresis      Iontophoresis   Type of Iontophoresis Dexamethasone    Location Rt post tib insertion    Dose 1.0 cc    Time 4-hour patch                  PT Education - 11/09/20 1508    Education Details HEP, ionto    Person(s) Educated Patient    Methods Explanation;Demonstration;Handout    Comprehension Verbalized understanding;Returned demonstration;Need further instruction            PT Short Term Goals - 11/09/20 1514      PT SHORT TERM GOAL #1   Title independent with initial HEP    Status New    Target Date 11/30/20             PT Long Term Goals - 11/09/20 1515      PT LONG TERM GOAL #1   Title independent with final HEP    Time 6    Period Weeks    Status New    Target Date 12/21/20      PT LONG TERM GOAL #2   Title report pain < 4/10 with standing activities for improved function    Time 6    Period Weeks    Status New    Target Date 12/21/20      PT LONG TERM GOAL #3   Title FOTO score improved to 65 for improved function    Time 6  Period Weeks    Status New    Target Date 12/21/20      PT LONG TERM GOAL #4   Title demonstrate 5/5 Rt ankle strength for improved function    Time 6    Period Weeks    Status New    Target Date 12/21/20                  Plan - 11/09/20 1504    Clinical Impression Statement Pt is a 40 y/o male who presents to OPPT for chronic pain in bil feet, with Rt worse than Lt most consistent with posterior  tibialis tendinopathy. Pt demonstrates decreased strength and as well as overpronation with amb and standing affecting functional mobility.  Pt will benefit from PT to address deficits listed    Personal Factors and Comorbidities Comorbidity 1    Comorbidities HTN    Examination-Activity Limitations Squat;Stairs;Stand;Lift;Locomotion Level    Examination-Participation Restrictions Community Activity;Yard Work;Occupation    Stability/Clinical Decision Making Stable/Uncomplicated    Clinical Decision Making Low    Rehab Potential Good    PT Frequency 1x / week    PT Duration 6 weeks    PT Treatment/Interventions ADLs/Self Care Home Management;Cryotherapy;Electrical Stimulation;Iontophoresis 4mg /ml Dexamethasone;Moist Heat;Therapeutic exercise;Therapeutic activities;Functional mobility training;Stair training;Gait training;Ultrasound;Neuromuscular re-education;Patient/family education;Manual techniques;Vasopneumatic Device;Taping;Dry needling    PT Next Visit Plan review HEP, assess response to ionto #1, continue to work on gentle strengthening and stretching    PT Home Exercise Plan Access Code:    Consulted and Agree with Plan of Care Patient           Patient will benefit from skilled therapeutic intervention in order to improve the following deficits and impairments:  Abnormal gait,Pain,Decreased mobility,Decreased strength  Visit Diagnosis: Pain in right ankle and joints of right foot - Plan: PT plan of care cert/re-cert  Pain in left ankle and joints of left foot - Plan: PT plan of care cert/re-cert  Muscle weakness (generalized) - Plan: PT plan of care cert/re-cert     Problem List Patient Active Problem List   Diagnosis Date Noted  . Acute bronchitis due to COVID-19 virus 07/19/2020  . Cough 07/18/2020  . GAD (generalized anxiety disorder) 01/28/2020  . Vitamin D deficiency disease 01/26/2020  . Routine general medical examination at a health care facility  01/25/2020  . Essential hypertension 01/25/2020  . Migraine without aura and without status migrainosus, not intractable 01/25/2020  . Routine adult health maintenance 10/11/2016  . Hemorrhoid 10/11/2016      10/13/2016, PT, DPT 11/09/20 3:22 PM    [facility] [address] [place], [place], [place] Phone: [phone]   Fax:  [phone]  Name: Devin Moreno MRN: Sherlie Ban Date of Birth: 11/10/1980

## 2020-11-09 NOTE — Patient Instructions (Signed)
Access Code: [email or web address] URL: [email or web address] Date: 11/09/2020 Prepared by: Moshe Cipro  Exercises Standing Heel Raise with Toes Turned In - 1 x daily - 7 x weekly - 2 sets - 10 reps Standing Heel Raise with Toes Turned Out - 1 x daily - 7 x weekly - 2 sets - 10 reps Arch Lifting - 1 x daily - 7 x weekly - 2 sets - 10 reps - 5 sec hold  Patient Education [place] Patient Instructions

## 2020-11-10 ENCOUNTER — Ambulatory Visit (INDEPENDENT_AMBULATORY_CARE_PROVIDER_SITE_OTHER): Payer: BC Managed Care – PPO | Admitting: Internal Medicine

## 2020-11-10 ENCOUNTER — Encounter: Payer: Self-pay | Admitting: Internal Medicine

## 2020-11-10 VITALS — BP 110/78 | HR 92 | Temp 98.4°F

## 2020-11-10 DIAGNOSIS — R300 Dysuria: Secondary | ICD-10-CM | POA: Diagnosis not present

## 2020-11-10 DIAGNOSIS — N4282 Prostatosis syndrome: Secondary | ICD-10-CM

## 2020-11-10 DIAGNOSIS — I10 Essential (primary) hypertension: Secondary | ICD-10-CM

## 2020-11-10 DIAGNOSIS — E559 Vitamin D deficiency, unspecified: Secondary | ICD-10-CM | POA: Diagnosis not present

## 2020-11-10 LAB — POCT URINALYSIS DIPSTICK
Bilirubin, UA: NEGATIVE
Blood, UA: NEGATIVE
Glucose, UA: NEGATIVE
Ketones, UA: POSITIVE
Leukocytes, UA: NEGATIVE
Nitrite, UA: NEGATIVE
Protein, UA: NEGATIVE
Spec Grav, UA: 1.025 (ref 1.010–1.025)
Urobilinogen, UA: 0.2 E.U./dL
pH, UA: 6 (ref 5.0–8.0)

## 2020-11-10 MED ORDER — NAPROXEN 500 MG PO TABS: 500.0000 mg | ORAL_TABLET | Freq: Two times a day (BID) | ORAL | 2 refills | Status: DC

## 2020-11-10 MED ORDER — TADALAFIL 5 MG PO TABS: 5.0000 mg | ORAL_TABLET | Freq: Every day | ORAL | 5 refills | Status: DC

## 2020-11-10 MED ORDER — VITAMIN D3 50 MCG (2000 UT) PO CAPS: 2000.0000 [IU] | ORAL_CAPSULE | Freq: Every day | ORAL | 3 refills | Status: DC

## 2020-11-10 MED ORDER — CIPROFLOXACIN HCL 500 MG PO TABS: 500.0000 mg | ORAL_TABLET | Freq: Two times a day (BID) | ORAL | 0 refills | Status: DC

## 2020-11-10 NOTE — Assessment & Plan Note (Addendum)
Start Vit D

## 2020-11-10 NOTE — Progress Notes (Signed)
Subjective:  Patient ID: Devin Moreno, male    DOB: Jul 24, 1980  Age: 40 y.o. MRN: [id number]  CC: Abdominal Pain (Pt states the discomfort has bee for a while now. Also having a little burning when urinating, and hesitantly to urine after sex )   HPI Devin Moreno presents for painful ejaculation, pain after sex x 2 years - worse lately Pt had vasectomy by Dr Mena Goes, had a cystoscopy a couple years ago.  He is complaining of erectile dysfunction at times.  No history of STD.  He has a sedentary work  Outpatient Medications Prior to Visit  Medication Sig Dispense Refill  . irbesartan (AVAPRO) 150 MG tablet Take 1 tablet (150 mg total) by mouth daily. 90 tablet 0  . lisdexamfetamine (VYVANSE) 50 MG capsule Take 50 mg by mouth daily.    Marland Kitchen Ubrogepant (UBRELVY) 100 MG TABS Take 1 tablet by mouth daily as needed. 30 tablet 1   No facility-administered medications prior to visit.    ROS: Review of Systems  Constitutional: Negative for appetite change, fatigue and unexpected weight change.  HENT: Negative for congestion, nosebleeds, sneezing, sore throat and trouble swallowing.   Eyes: Negative for itching and visual disturbance.  Respiratory: Negative for cough.   Cardiovascular: Negative for chest pain, palpitations and leg swelling.  Gastrointestinal: Negative for abdominal distention, blood in stool, diarrhea and nausea.  Endocrine: Negative for polydipsia.  Genitourinary: Positive for dysuria and frequency. Negative for hematuria and urgency.  Musculoskeletal: Negative for back pain, gait problem, joint swelling and neck pain.  Skin: Negative for rash.  Neurological: Negative for dizziness, tremors, speech difficulty and weakness.  Psychiatric/Behavioral: Negative for agitation, dysphoric mood and sleep disturbance. The patient is not nervous/anxious.     Objective:  BP 110/78 (BP Location: Left Arm)   Pulse 92   Temp 98.4 F (36.9 C) (Oral)   SpO2 97%   BP Readings  from Last 3 Encounters:  11/10/20 110/78  10/26/20 110/78  07/18/20 136/82    Wt Readings from Last 3 Encounters:  10/26/20 243 lb 4.8 oz (110.4 kg)  07/18/20 235 lb (106.6 kg)  02/17/20 234 lb (106.1 kg)    Physical Exam Constitutional:      General: He is not in acute distress.    Appearance: He is well-developed.     Comments: NAD  Eyes:     Conjunctiva/sclera: Conjunctivae normal.     Pupils: Pupils are equal, round, and reactive to light.  Neck:     Thyroid: No thyromegaly.     Vascular: No JVD.  Cardiovascular:     Rate and Rhythm: Normal rate and regular rhythm.     Heart sounds: Normal heart sounds. No murmur heard. No friction rub. No gallop.   Pulmonary:     Effort: Pulmonary effort is normal. No respiratory distress.     Breath sounds: Normal breath sounds. No wheezing or rales.  Chest:     Chest wall: No tenderness.  Abdominal:     General: Bowel sounds are normal. There is no distension.     Palpations: Abdomen is soft. There is no mass.     Tenderness: There is no abdominal tenderness. There is no guarding or rebound.  Genitourinary:    Penis: Normal.      Testes: Normal.  Musculoskeletal:        General: No tenderness. Normal range of motion.     Cervical back: Normal range of motion.  Lymphadenopathy:     Cervical: No  cervical adenopathy.  Skin:    General: Skin is warm and dry.     Findings: No rash.  Neurological:     Mental Status: He is alert and oriented to person, place, and time.     Cranial Nerves: No cranial nerve deficit.     Motor: No abnormal muscle tone.     Coordination: Coordination normal.     Gait: Gait normal.     Deep Tendon Reflexes: Reflexes are normal and symmetric.  Psychiatric:        Behavior: Behavior normal.        Thought Content: Thought content normal.        Judgment: Judgment normal.   Mild varicocele Patient declined prostate exam  Lab Results  Component Value Date   WBC 6.9 07/18/2020   HGB 16.0  07/18/2020   HCT 47.5 07/18/2020   PLT 221.0 07/18/2020   GLUCOSE 114 (H) 07/18/2020   CHOL 197 01/25/2020   TRIG 206 (H) 01/25/2020   HDL 51 01/25/2020   LDLCALC 114 (H) 01/25/2020   ALT 23 01/25/2020   AST 15 01/25/2020   NA 140 07/18/2020   K 3.6 07/18/2020   CL 101 07/18/2020   CREATININE 1.02 07/18/2020   BUN 14 07/18/2020   CO2 32 07/18/2020   TSH 0.78 01/25/2020   PSA 0.49 12/27/2017    US Abdomen Limited RUQ  Result Date: 01/15/2018 CLINICAL DATA:  Follow-up presumed cyst on CT. EXAM: ULTRASOUND ABDOMEN LIMITED RIGHT UPPER QUADRANT COMPARISON:  01/03/2018 abdominal CT FINDINGS: Gallbladder: Echogenic, nonshadowing, anti dependent mural nodule measuring 7 mm. No gallstones or wall thickening. Common bile duct: Diameter: 4 mm Liver: Two simple appearing cysts are seen within the right lobe measuring up to 1 cm. No solid mass is noted. Normal parenchymal echogenicity. Portal vein is patent on color Doppler imaging with normal direction of blood flow towards the liver. IMPRESSION: 1. Small simple hepatic cysts.  Negative for solid liver mass. 2. 7 mm gallbladder polyp. This meets size threshold for 1 year follow-up. Electronically Signed   By: Marnee Spring M.D.   On: 01/15/2018 13:54    Assessment & Plan:    Follow-up: No follow-ups on file.  Sonda Primes, MD

## 2020-11-10 NOTE — Assessment & Plan Note (Addendum)
Probable prostatitis with urinary symptoms.  Obtain UA.  Start Cialis 5 mg/d Empiric Cipro x 2 wks Start naproxen for inflammation

## 2020-11-11 ENCOUNTER — Telehealth: Payer: Self-pay | Admitting: *Deleted

## 2020-11-11 NOTE — Telephone Encounter (Signed)
Rec'd PA for pt Cialis 5 mg. Completed via cover-my-meds w/ (Key: [id number]). Rec'd msg stating " Your information has been submitted to [employer]. To check for an updated outcome later"...Raechel Chute

## 2020-11-13 NOTE — Assessment & Plan Note (Signed)
BP Readings from Last 3 Encounters:  11/10/20 110/78  10/26/20 110/78  07/18/20 136/82

## 2020-11-14 NOTE — Telephone Encounter (Signed)
Rec'd notice of approval on med w/ approved dates beginning 11/11/20 - 11/12/23. Faxed approval to [facility].../lmb

## 2020-11-16 ENCOUNTER — Encounter: Payer: Self-pay | Admitting: Physical Therapy

## 2020-11-16 ENCOUNTER — Other Ambulatory Visit: Payer: Self-pay

## 2020-11-16 ENCOUNTER — Ambulatory Visit (INDEPENDENT_AMBULATORY_CARE_PROVIDER_SITE_OTHER): Payer: BC Managed Care – PPO | Admitting: Physical Therapy

## 2020-11-16 DIAGNOSIS — M25571 Pain in right ankle and joints of right foot: Secondary | ICD-10-CM | POA: Diagnosis not present

## 2020-11-16 DIAGNOSIS — M6281 Muscle weakness (generalized): Secondary | ICD-10-CM | POA: Diagnosis not present

## 2020-11-16 DIAGNOSIS — M25572 Pain in left ankle and joints of left foot: Secondary | ICD-10-CM | POA: Diagnosis not present

## 2020-11-16 NOTE — Therapy (Signed)
[facility] [address] [place], [place], [address] Phone: [phone]   Fax:  [phone]  Physical Therapy Treatment  Patient Details  Name: Devin Moreno MRN: [id number] Date of Birth: September 25, 1980 Referring Provider (PT): Dr. Lynne Leader   Encounter Date: 11/16/2020   PT End of Session - 11/16/20 1011    Visit Number 2    Number of Visits 6    Date for PT Re-Evaluation 12/21/20    Authorization Type BCBS $72 copay    PT Start Time 0935    PT Stop Time 1010    PT Time Calculation (min) 35 min    Activity Tolerance Patient tolerated treatment well    Behavior During Therapy [facility] for tasks assessed/performed           Past Medical History:  Diagnosis Date  . Hypertension     Past Surgical History:  Procedure Laterality Date  . WISDOM TOOTH EXTRACTION      There were no vitals filed for this visit.   Subjective Assessment - 11/16/20 0936    Subjective Pt felt like patch was helpful, pain is less intense.  Exercises are going well.    Limitations Standing;Walking    How long can you stand comfortably? couple hours - pain following    Diagnostic tests posterior tib tendinopathy    Patient Stated Goals improve pain    Currently in Pain? No/denies                             [facility] Adult PT Treatment/Exercise - 11/16/20 0937      Exercises   Exercises Ankle      Iontophoresis   Type of Iontophoresis Dexamethasone    Location Rt post tib insertion    Dose 1.0 cc    Time 4-hour patch      Ankle Exercises: Stretches   Slant Board Stretch 3 reps;30 seconds   gastroc and soleus     Ankle Exercises: Aerobic   Recumbent Bike L3 x 8 min      Ankle Exercises: Standing   SLS on compliant surface 5 x 10 sec bil; added hand tap to cones x 2 min each with intermittent opposite foot tap down for balance    Heel Raises Both;20 reps    Heel Raises Limitations toes in/toes out      Ankle Exercises: Seated    Other Seated Ankle Exercises seated arch lifts 10 x 5 sec hold; bil                  PT Education - 11/16/20 1010    Education Details HEP    Person(s) Educated Patient    Methods Explanation;Demonstration;Handout    Comprehension Verbalized understanding;Returned demonstration            PT Short Term Goals - 11/16/20 1011      PT SHORT TERM GOAL #1   Title independent with initial HEP    Status Achieved    Target Date 11/30/20             PT Long Term Goals - 11/09/20 1515      PT LONG TERM GOAL #1   Title independent with final HEP    Time 6    Period Weeks    Status New    Target Date 12/21/20      PT LONG TERM GOAL #2   Title report pain < 4/10 with standing activities  for improved function    Time 6    Period Weeks    Status New    Target Date 12/21/20      PT LONG TERM GOAL #3   Title FOTO score improved to 65 for improved function    Time 6    Period Weeks    Status New    Target Date 12/21/20      PT LONG TERM GOAL #4   Title demonstrate 5/5 Rt ankle strength for improved function    Time 6    Period Weeks    Status New    Target Date 12/21/20                 Plan - 11/16/20 1011    Clinical Impression Statement Pt has met STG #1 at this time and is reporting overall improvement in pain following prior session.  LTGs ongoing at this time as only 2nd visit, and will continue to benefit from PT to maximize function.    Personal Factors and Comorbidities Comorbidity 1    Comorbidities HTN    Examination-Activity Limitations Squat;Stairs;Stand;Lift;Locomotion Level    Examination-Participation Restrictions Community Activity;Yard Work;Occupation    Stability/Clinical Decision Making Stable/Uncomplicated    Rehab Potential Good    PT Frequency 1x / week    PT Duration 6 weeks    PT Treatment/Interventions ADLs/Self Care Home Management;Cryotherapy;Electrical Stimulation;Iontophoresis 12m/ml Dexamethasone;Moist Heat;Therapeutic  exercise;Therapeutic activities;Functional mobility training;Stair training;Gait training;Ultrasound;Neuromuscular re-education;Patient/family education;Manual techniques;Vasopneumatic Device;Taping;Dry needling    PT Next Visit Plan assess response to ionto #2, continue to work on gentle strengthening and stretching    PT Home Exercise Plan Access Code: [email or web address]   Consulted and Agree with Plan of Care Patient           Patient will benefit from skilled therapeutic intervention in order to improve the following deficits and impairments:  Abnormal gait,Pain,Decreased mobility,Decreased strength  Visit Diagnosis: Pain in right ankle and joints of right foot  Pain in left ankle and joints of left foot  Muscle weakness (generalized)     Problem List Patient Active Problem List   Diagnosis Date Noted  . Prostatitis syndrome 11/10/2020  . Acute bronchitis due to COVID-19 virus 07/19/2020  . Cough 07/18/2020  . GAD (generalized anxiety disorder) 01/28/2020  . Vitamin D deficiency disease 01/26/2020  . Routine general medical examination at a health care facility 01/25/2020  . Essential hypertension 01/25/2020  . Migraine without aura and without status migrainosus, not intractable 01/25/2020  . Routine adult health maintenance 10/11/2016  . Hemorrhoid 10/11/2016      SLaureen Abrahams PT, DPT 11/16/20 10:13 AM     [facility] [address] [place] [address]Phone: [phone]  Fax:  [phone] Name: AAfshin ChrystalMRN: [id number] of Birth: 906-01-1981

## 2020-11-16 NOTE — Patient Instructions (Signed)
Access Code: [email or web address] URL: [email or web address] Date: 11/16/2020 Prepared by: Moshe Cipro  Exercises Standing Heel Raise with Toes Turned In - 1 x daily - 7 x weekly - 2 sets - 10 reps Standing Heel Raise with Toes Turned Out - 1 x daily - 7 x weekly - 2 sets - 10 reps Arch Lifting - 1 x daily - 7 x weekly - 2 sets - 10 reps - 5 sec hold Standing Gastroc Stretch on Step with Counter Support - 1 x daily - 7 x weekly - 1 sets - 3 reps - 30 sec hold Standing Soleus Stretch on Step - 1 x daily - 7 x weekly - 1 sets - 3 reps - 30 sec hold  Patient Education [place] Patient Instructions

## 2020-11-18 ENCOUNTER — Other Ambulatory Visit: Payer: Self-pay | Admitting: Internal Medicine

## 2020-11-18 DIAGNOSIS — I10 Essential (primary) hypertension: Secondary | ICD-10-CM

## 2020-11-24 NOTE — Progress Notes (Signed)
I, Christoper Fabian, LAT, ATC, am serving as scribe for Dr. Clementeen Graham.  Devin Moreno is a 40 y.o. male who presents to [facility] today for f/u of chronic B foot pain, R >L.  He was last seen by Dr. Denyse Amass on 10/26/20 and was referred to PT of which he's completed 2 sessions.  He was also shown a HEP, advised to use an arch strap and/or ankle compression sleeve and Voltaren gel.  Since his last visit, pt reports that his R foot is feeling better, rating his improvement at 60-70%.  He reports doing iontophoresis at PT.  He has been doing his HEP per PT and has additional visits scheduled w/ PT.  He has not purchased an arch strap or an ankle compression sleeve yet per PT recommendation to see how he did w/ those treatments.    Diagnostic imaging: R foot XR- 10/26/20  Pertinent review of systems: No fevers or chills  Relevant historical information: Hypertension   Exam:  BP 110/84 (BP Location: Right Arm, Patient Position: Sitting, Cuff Size: Normal)   Pulse 92   Ht 5\' 10"  (1.778 m)   Wt 239 lb 3.2 oz (108.5 kg)   SpO2 96%   BMI 34.32 kg/m  General: Well Developed, well nourished, and in no acute distress.   MSK: Right foot normal.  Mildly tender palpation navicular prominence.  Normal strength.    Lab and Radiology Results DG Foot Complete Right  Result Date: 10/28/2020 CLINICAL DATA:  Pain and swelling EXAM: RIGHT FOOT COMPLETE - 3+ VIEW COMPARISON:  None. FINDINGS: Frontal, oblique, and lateral views obtained. No fracture or dislocation. There is minimal narrowing of the first MTP joint. Other joint spaces appear normal. No erosive change. There is a 2 mm non metallic radiopaque foreign body located in the medial, volar aspect of the foot at the level of the proximal first metatarsal. No other apparent radiopaque foreign body. No erosive change. IMPRESSION: No fracture or dislocation. Mild narrowing first MTP joint. Other joint spaces appear normal. 2 mm  non metallic radiopaque foreign body in the volar, medial aspect of the foot at the level of the first proximal metatarsal. Electronically Signed   By: 12/28/2020 III M.D.   On: 10/28/2020 10:50   12/28/2020 LIMITED JOINT SPACE STRUCTURES LOW RIGHT(NO LINKED CHARGES)  Result Date: 11/08/2020 Diagnostic Limited MSK Ultrasound of: Right foot and ankle Intact posterior tibialis tendon. Slight avulsion fragment at navicular prominence consistent with insertional tenosynovitis of posterior tibialis tendon onto the navicular. No tendon tears visible. Foot and ankle otherwise normal-appearing Impression: Insertional posterior tibialis tendinitis  I, 11/10/2020, personally (independently) visualized and performed the interpretation of the images attached in this note.     Assessment and Plan: 40 y.o. 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  Patient is nontender along the plantar aspect of the mid arch where nonmetallic object seen on x-ray.  Patient fortunately has been improving with physical therapy.  Plan to finish out physical therapy and proceed with home exercise program.  Recommend good footwear with good arch support.  Okay to proceed with compression sleeve if needed.  Recheck with me as needed.     Discussed warning signs or symptoms. Please see discharge instructions. Patient expresses understanding.   The above documentation has been reviewed and is accurate and complete 46, M.D.  Total encounter time 20 minutes including face-to-face time with the patient and, reviewing past medical  record, and charting on the date of service.   Discussed treatment plan and options.

## 2020-11-25 ENCOUNTER — Ambulatory Visit: Payer: BC Managed Care – PPO | Admitting: Family Medicine

## 2020-11-25 ENCOUNTER — Encounter: Payer: Self-pay | Admitting: Family Medicine

## 2020-11-25 ENCOUNTER — Other Ambulatory Visit: Payer: Self-pay

## 2020-11-25 VITALS — BP 110/84 | HR 92 | Ht 70.0 in | Wt 239.2 lb

## 2020-11-25 DIAGNOSIS — M76822 Posterior tibial tendinitis, left leg: Secondary | ICD-10-CM | POA: Diagnosis not present

## 2020-11-25 DIAGNOSIS — M76821 Posterior tibial tendinitis, right leg: Secondary | ICD-10-CM

## 2020-11-25 DIAGNOSIS — M79671 Pain in right foot: Secondary | ICD-10-CM

## 2020-11-25 NOTE — Patient Instructions (Signed)
Thank you for coming in today.  Continue the PT and home exercises.   Recheck as needed.  Continue the compression sleeve as needed.

## 2020-11-30 ENCOUNTER — Ambulatory Visit (INDEPENDENT_AMBULATORY_CARE_PROVIDER_SITE_OTHER): Payer: BC Managed Care – PPO | Admitting: Physical Therapy

## 2020-11-30 ENCOUNTER — Other Ambulatory Visit: Payer: Self-pay

## 2020-11-30 ENCOUNTER — Encounter: Payer: Self-pay | Admitting: Physical Therapy

## 2020-11-30 DIAGNOSIS — M25571 Pain in right ankle and joints of right foot: Secondary | ICD-10-CM | POA: Diagnosis not present

## 2020-11-30 DIAGNOSIS — M25572 Pain in left ankle and joints of left foot: Secondary | ICD-10-CM | POA: Diagnosis not present

## 2020-11-30 DIAGNOSIS — M6281 Muscle weakness (generalized): Secondary | ICD-10-CM | POA: Diagnosis not present

## 2020-11-30 NOTE — Therapy (Addendum)
[facility] Physical Therapy [address] [place], [place], [address] Phone: [phone]   Fax:  [phone]  Physical Therapy Treatment/Discharge Summary  Patient Details  Name: Devin Moreno MRN: [id number] Date of Birth: 01-01-1981 Referring Provider (PT): Dr. Lynne Leader   Encounter Date: 11/30/2020   PT End of Session - 11/30/20 1439     Visit Number 3    Number of Visits 6    Date for PT Re-Evaluation 12/21/20    Authorization Type [employer] $72 copay    PT Start Time 1349    PT Stop Time 1430    PT Time Calculation (min) 41 min    Activity Tolerance Patient tolerated treatment well    Behavior During Therapy [facility] for tasks assessed/performed             Past Medical History:  Diagnosis Date   Hypertension     Past Surgical History:  Procedure Laterality Date   WISDOM TOOTH EXTRACTION      There were no vitals filed for this visit.   Subjective Assessment - 11/30/20 1351     Subjective feels much better - still hasn't been to [employer] for new shoes    Limitations Standing;Walking    How long can you stand comfortably? couple hours - pain following    Diagnostic tests posterior tib tendinopathy    Patient Stated Goals improve pain    Currently in Pain? 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 Lynne Leader      Observation/Other Assessments   Focus on Therapeutic Outcomes (FOTO)  1                           [facility] Adult PT Treatment/Exercise - 11/30/20 1354       Ankle Exercises: Aerobic   Recumbent Bike L3 x 8 min      Ankle Exercises: Stretches   Slant Board Stretch 3 reps;30 seconds   gastroc and soleus     Ankle Exercises: Standing   SLS on compliant surface hand tap to cones x 2 min each  with intermittent opposite foot tap down for balance    Heel Raises Both;20 reps    Heel Raises Limitations toes in/toes out    Other Standing Ankle Exercises skipping 40' x 2    Other Standing Ankle Exercises single limb calf raise with L4 band x5 reps; 4 directions      Ankle Exercises: Machines for Strengthening   Cybex Leg Press 75# jumping on shuttle 3x10                      PT Short Term Goals - 11/16/20 1011       PT SHORT TERM GOAL #1   Title independent with initial HEP    Status Achieved    Target Date 11/30/20               PT Long Term Goals - 11/30/20 1439       PT LONG TERM GOAL #1   Title independent with final HEP    Time 6    Period Weeks    Status On-going    Target  Date 12/21/20      PT LONG TERM GOAL #2   Title report pain < 4/10 with standing activities for improved function    Time 6    Period Weeks    Status Achieved      PT LONG TERM GOAL #3   Title FOTO score improved to 65 for improved function    Time 6    Period Weeks    Status Achieved      PT LONG TERM GOAL #4   Title demonstrate 5/5 Rt ankle strength for improved function    Time 6    Period Weeks    Status On-going    Target Date 12/21/20                   Plan - 11/30/20 1440     Clinical Impression Statement Pt overall doing very well with PT with minimal pain reported at this time.  He has met 2/4 LTGs and anticipate he will meet remaining goals next visit.  Plan to finalize HEP and check strength for d/c.    Personal Factors and Comorbidities Comorbidity 1    Comorbidities HTN    Examination-Activity Limitations Squat;Stairs;Stand;Lift;Locomotion Level    Examination-Participation Restrictions Community Activity;Yard Work;Occupation    Stability/Clinical Decision Making Stable/Uncomplicated    Rehab Potential Good    PT Frequency 1x / week    PT Duration 6 weeks    PT Treatment/Interventions ADLs/Self Care Home  Management;Cryotherapy;Electrical Stimulation;Iontophoresis 81m/ml Dexamethasone;Moist Heat;Therapeutic exercise;Therapeutic activities;Functional mobility training;Stair training;Gait training;Ultrasound;Neuromuscular re-education;Patient/family education;Manual techniques;Vasopneumatic Device;Taping;Dry needling    PT Next Visit Plan plan for d/c or hold unless pain returns    PT Home Exercise Plan Access Code: [email or web address]   Consulted and Agree with Plan of Care Patient             Patient will benefit from skilled therapeutic intervention in order to improve the following deficits and impairments:  Abnormal gait,Pain,Decreased mobility,Decreased strength  Visit Diagnosis: Pain in right ankle and joints of right foot  Pain in left ankle and joints of left foot  Muscle weakness (generalized)     Problem List Patient Active Problem List   Diagnosis Date Noted   Prostatitis syndrome 11/10/2020   Acute bronchitis due to COVID-19 virus 07/19/2020   Cough 07/18/2020   GAD (generalized anxiety disorder) 01/28/2020   Vitamin D deficiency disease 01/26/2020   Routine general medical examination at a health care facility 01/25/2020   Essential hypertension 01/25/2020   Migraine without aura and without status migrainosus, not intractable 01/25/2020   Routine adult health maintenance 10/11/2016   Hemorrhoid 10/11/2016      SLaureen Abrahams PT, DPT 11/30/20 2:43 PM    [facility] [address] [address]Phone: [phone]  Fax:  [phone] Name: ABranton EinsteinMRN: [id number] of Birth: [birth year]    PHYSICAL THERAPY DISCHARGE SUMMARY  Visits from Start of Care: 3  Current functional level related to goals / functional outcomes: See above   Remaining deficits: See above   Education / Equipment: HEP   Patient agrees to discharge. Patient goals were partially met. Patient is being discharged due  to being pleased with the current functional level.  SLaureen Abrahams PT, DPT 02/06/21 2:32 PM  [facility] [address] [address]Phone: [phone]  Fax:  [phone]

## 2020-12-07 ENCOUNTER — Encounter: Payer: BC Managed Care – PPO | Admitting: Physical Therapy

## 2020-12-14 ENCOUNTER — Encounter: Payer: BC Managed Care – PPO | Admitting: Physical Therapy

## 2020-12-24 ENCOUNTER — Other Ambulatory Visit: Payer: Self-pay | Admitting: Internal Medicine

## 2020-12-24 DIAGNOSIS — I10 Essential (primary) hypertension: Secondary | ICD-10-CM

## 2020-12-27 MED ORDER — IRBESARTAN 150 MG PO TABS: 150.0000 mg | ORAL_TABLET | Freq: Every day | ORAL | 0 refills | Status: DC

## 2021-01-25 ENCOUNTER — Telehealth: Payer: Self-pay | Admitting: Internal Medicine

## 2021-01-27 NOTE — Telephone Encounter (Signed)
PA initiated  Key: [facility]

## 2021-01-30 NOTE — Telephone Encounter (Signed)
Approved 01/27/2021 - 01/27/2022

## 2021-04-04 ENCOUNTER — Other Ambulatory Visit: Payer: Self-pay | Admitting: Internal Medicine

## 2021-04-04 DIAGNOSIS — I10 Essential (primary) hypertension: Secondary | ICD-10-CM

## 2021-04-10 ENCOUNTER — Other Ambulatory Visit: Payer: Self-pay | Admitting: Internal Medicine

## 2021-04-10 DIAGNOSIS — I10 Essential (primary) hypertension: Secondary | ICD-10-CM

## 2021-05-10 ENCOUNTER — Telehealth: Payer: Self-pay | Admitting: Internal Medicine

## 2021-05-10 NOTE — Telephone Encounter (Signed)
Pt would like to make Ria Clock his pcp, he currently see's Sanda Linger at [facility]. Pt previously seen Dayton Scrape before she switched locations and would prefer to resume care with Dayton Scrape. Please advise.

## 2021-05-11 ENCOUNTER — Other Ambulatory Visit: Payer: Self-pay

## 2021-05-11 ENCOUNTER — Ambulatory Visit
Admission: RE | Admit: 2021-05-11 | Discharge: 2021-05-11 | Disposition: A | Discharge status: Home / Self Care | Payer: BC Managed Care – PPO | Source: Ambulatory Visit | Attending: Physician Assistant | Admitting: Physician Assistant

## 2021-05-11 VITALS — BP 146/93 | HR 104 | Temp 99.1°F | Ht 69.0 in | Wt 230.0 lb

## 2021-05-11 DIAGNOSIS — J209 Acute bronchitis, unspecified: Secondary | ICD-10-CM

## 2021-05-11 DIAGNOSIS — J019 Acute sinusitis, unspecified: Secondary | ICD-10-CM

## 2021-05-11 MED ORDER — DOXYCYCLINE HYCLATE 100 MG PO CAPS: 100.0000 mg | ORAL_CAPSULE | Freq: Two times a day (BID) | ORAL | 0 refills | Status: DC

## 2021-05-11 NOTE — ED Triage Notes (Signed)
Patient c/o productive cough x 1 week, head congestion, possible sinus infection.  Patient has been taken OTC cold meds and nasal decongestant.  Patient is vaccinated for COVID.

## 2021-05-11 NOTE — ED Provider Notes (Signed)
[facility]    CSN: [id number] Arrival date & time: 05/11/21  1448      History   Chief Complaint Chief Complaint  Patient presents with   Appointment   Cough    HPI Treylon Moreno is a 40 y.o. male.   Patient here today for evaluation of sinus pressure, cough that started over a week ago. He states that symptoms started as what seemed to be allergies but then have progressed to more pressure and worsened cough. He does have history of pneumonia. He has not had fever recently. He has tried OTC treatment without resolution.   The history is provided by the patient.   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   Acute bronchitis due to COVID-19 virus 07/19/2020   Cough 07/18/2020   GAD (generalized anxiety disorder) 01/28/2020   Vitamin D deficiency disease 01/26/2020   Routine general medical examination at a health care facility 01/25/2020   Essential hypertension 01/25/2020   Migraine without aura and without status migrainosus, not intractable 01/25/2020   Routine adult health maintenance 10/11/2016   Hemorrhoid 10/11/2016    Past Surgical History:  Procedure Laterality Date   WISDOM TOOTH EXTRACTION         Home Medications    Prior to Admission medications   Medication Sig Start Date End Date Taking? Authorizing Provider  Cholecalciferol (VITAMIN D3) 50 MCG (2000 UT) capsule Take 1 capsule (2,000 Units total) by mouth daily. 11/10/20  Yes Plotnikov, Georgina Quint, MD  ciprofloxacin (CIPRO) 500 MG tablet Take 1 tablet (500 mg total) by mouth 2 (two) times daily. 11/10/20  Yes Plotnikov, Georgina Quint, MD  doxycycline (VIBRAMYCIN) 100 MG capsule Take 1 capsule (100 mg total) by mouth 2 (two) times daily. 05/11/21  Yes Tomi Bamberger, PA-C  lisdexamfetamine (VYVANSE) 50 MG capsule Take 50 mg by mouth daily.   Yes [provider]  naproxen  (NAPROSYN) 500 MG tablet Take 1 tablet (500 mg total) by mouth 2 (two) times daily with a meal. 11/10/20  Yes Plotnikov, Georgina Quint, MD  tadalafil (CIALIS) 5 MG tablet Take 1 tablet (5 mg total) by mouth daily. 11/10/20  Yes Plotnikov, Georgina Quint, MD  Ubrogepant (UBRELVY) 100 MG TABS Take 1 tablet by mouth daily as needed. 01/25/20  Yes Etta Grandchild, MD  irbesartan (AVAPRO) 150 MG tablet Take 1 tablet (150 mg total) by mouth daily. 12/27/20   Etta Grandchild, MD    Family History Family History  Problem Relation Age of Onset   Healthy Mother    Hypertension Father    Heart disease Father    Tuberculosis Maternal Grandmother    Prostate cancer Maternal Grandfather     Social History Social History   Tobacco Use   Smoking status: Former    Packs/day: 1.00    Years: 16.00    Pack years: 16.00    Types: Cigarettes   Smokeless tobacco: Never  Substance Use Topics   Alcohol use: Yes    Alcohol/week: 7.0 standard drinks    Types: 7 Cans of beer per week   Drug use: No     Allergies   Patient has no known allergies.   Review of Systems Review of Systems  Constitutional:  Negative for chills and fever.  HENT:  Positive for congestion and sinus pressure. Negative for ear pain and sore throat.  Eyes:  Negative for discharge and redness.  Respiratory:  Positive for cough. Negative for shortness of breath.   Gastrointestinal:  Negative for abdominal pain, nausea and vomiting.    Physical Exam Triage Vital Signs ED Triage Vitals  Enc Vitals Group     BP      Pulse      Resp      Temp      Temp src      SpO2      Weight      Height      Head Circumference      Peak Flow      Pain Score      Pain Loc      Pain Edu?      Excl. in GC?    No data found.  Updated Vital Signs BP (!) 146/93 (BP Location: Left Arm)   Pulse (!) 104   Temp 99.1 F (37.3 C) (Oral)   Ht 5\' 9"  (1.753 m)   Wt 230 lb (104.3 kg)   SpO2 96%   BMI 33.97 kg/m       Physical Exam Vitals and  nursing note reviewed.  Constitutional:      General: He is not in acute distress.    Appearance: Normal appearance. He is not ill-appearing.  HENT:     Head: Normocephalic and atraumatic.     Nose: Congestion present.  Eyes:     Conjunctiva/sclera: Conjunctivae normal.  Cardiovascular:     Rate and Rhythm: Normal rate and regular rhythm.     Heart sounds: Normal heart sounds. No murmur heard. Pulmonary:     Effort: Pulmonary effort is normal. No respiratory distress.     Breath sounds: Normal breath sounds. No wheezing, rhonchi or rales.     Comments: Deep breathing produces cough Skin:    General: Skin is warm and dry.  Neurological:     Mental Status: He is alert.  Psychiatric:        Mood and Affect: Mood normal.        Thought Content: Thought content normal.     UC Treatments / Results  Labs (all labs ordered are listed, but only abnormal results are displayed) Labs Reviewed - No data to display  EKG   Radiology No results found.  Procedures Procedures (including critical care time)  Medications Ordered in UC Medications - No data to display  Initial Impression / Assessment and Plan / UC Course  I have reviewed the triage vital signs and the nursing notes.  Pertinent labs & imaging results that were available during my care of the patient were reviewed by me and considered in my medical decision making (see chart for details).   Will treat with antibiotic to cover sinusitis and recommended follow up if symptoms fail to improve or worsen. Suspect likely beginning of bronchitis as well.   Final Clinical Impressions(s) / UC Diagnoses   Final diagnoses:  Acute sinusitis, recurrence not specified, unspecified location  Acute bronchitis, unspecified organism   Discharge Instructions   None    ED Prescriptions     Medication Sig Dispense Auth. Provider   doxycycline (VIBRAMYCIN) 100 MG capsule Take 1 capsule (100 mg total) by mouth 2 (two) times daily. 20  capsule , PA-C      PDMP not reviewed this encounter.   Tomi Bamberger, PA-C 05/11/21 1512

## 2021-07-03 ENCOUNTER — Telehealth: Payer: Self-pay | Admitting: Internal Medicine

## 2021-07-26 ENCOUNTER — Ambulatory Visit: Payer: BC Managed Care – PPO | Admitting: Family Medicine

## 2021-07-26 ENCOUNTER — Encounter: Payer: Self-pay | Admitting: Family Medicine

## 2021-07-26 VITALS — BP 125/84 | HR 85 | Temp 98.5°F | Ht 69.0 in | Wt 244.0 lb

## 2021-07-26 DIAGNOSIS — R0981 Nasal congestion: Secondary | ICD-10-CM

## 2021-07-26 DIAGNOSIS — H6693 Otitis media, unspecified, bilateral: Secondary | ICD-10-CM

## 2021-07-26 DIAGNOSIS — J039 Acute tonsillitis, unspecified: Secondary | ICD-10-CM | POA: Diagnosis not present

## 2021-07-26 LAB — POC INFLUENZA A&B (BINAX/QUICKVUE)
Influenza A, POC: NEGATIVE
Influenza B, POC: NEGATIVE

## 2021-07-26 MED ORDER — AMOXICILLIN-POT CLAVULANATE 875-125 MG PO TABS: 1.0000 | ORAL_TABLET | Freq: Two times a day (BID) | ORAL | 0 refills | Status: AC

## 2021-07-26 NOTE — Progress Notes (Signed)
Acute Office Visit  Subjective:    Patient ID: Devin Moreno, male    DOB: [birth year], 41 y.o.   MRN: [id number]  Chief Complaint  Patient presents with   Cough   Headache     Patient is in today for URI symptoms.   Patient reports 4 days ago he and his entire family had fever and URI symptoms. He is/has been having chills, body aches, dry cough, sore throat, tonsillar exudate and lymphadenopathy, nausea, diarrhea, fatigue, poor appetite. Reports tonsils are chronically enlarged, but now they both have white spots. Negative home COVID test 2 days ago. No vomiting, chest pain, dyspnea, wheezing, loss of taste/smell.     Past Medical History:  Diagnosis Date   Hypertension     Past Surgical History:  Procedure Laterality Date   WISDOM TOOTH EXTRACTION      Family History  Problem Relation Age of Onset   Healthy Mother    Hypertension Father    Heart disease Father    Tuberculosis Maternal Grandmother    Prostate cancer Maternal Grandfather     Social History   Socioeconomic History   Marital status: Married    Spouse name: Not on file   Number of children: 1   Years of education: 18   Highest education level: Not on file  Occupational History   Occupation: Artist  Tobacco Use   Smoking status: Former    Packs/day: 1.00    Years: 16.00    Pack years: 16.00    Types: Cigarettes   Smokeless tobacco: Never  Substance and Sexual Activity   Alcohol use: Yes    Alcohol/week: 7.0 standard drinks    Types: 7 Cans of beer per week   Drug use: No   Sexual activity: Not on file  Other Topics Concern   Not on file  Social History Narrative   Fun/Hobby: Farm and raise animals.    Social Determinants of Health   Financial Resource Strain: Not on file  Food Insecurity: Not on file  Transportation Needs: Not on file  Physical Activity: Not on file  Stress: Not on file  Social Connections: Not on file  Intimate Partner Violence: Not on file     Outpatient Medications Prior to Visit  Medication Sig Dispense Refill   irbesartan (AVAPRO) 150 MG tablet Take 1 tablet (150 mg total) by mouth daily. (Patient not taking: Reported on 07/26/2021) 90 tablet 0   methylphenidate 36 MG PO CR tablet Take 36 mg by mouth every morning.     tadalafil (CIALIS) 5 MG tablet Take 1 tablet (5 mg total) by mouth daily. 30 tablet 5   tamsulosin (FLOMAX) 0.4 MG CAPS capsule Take 0.4 mg by mouth at bedtime.     Ubrogepant (UBRELVY) 100 MG TABS Take 1 tablet by mouth daily as needed. 30 tablet 1   Cholecalciferol (VITAMIN D3) 50 MCG (2000 UT) capsule Take 1 capsule (2,000 Units total) by mouth daily. 100 capsule 3   ciprofloxacin (CIPRO) 500 MG tablet Take 1 tablet (500 mg total) by mouth 2 (two) times daily. 28 tablet 0   doxycycline (VIBRAMYCIN) 100 MG capsule Take 1 capsule (100 mg total) by mouth 2 (two) times daily. 20 capsule 0   lisdexamfetamine (VYVANSE) 50 MG capsule Take 50 mg by mouth daily.     naproxen (NAPROSYN) 500 MG tablet Take 1 tablet (500 mg total) by mouth 2 (two) times daily with a meal. 30 tablet 2   No facility-administered medications  prior to visit.    No Known Allergies  Review of systems All review of systems negative except what is listed in the HPI     Objective:    Physical Exam Vitals reviewed.  Constitutional:      Appearance: He is well-developed.  HENT:     Head: Normocephalic and atraumatic.     Right Ear: Tympanic membrane is erythematous and bulging.     Left Ear: Tympanic membrane is erythematous and bulging.     Mouth/Throat:     Pharynx: Oropharyngeal exudate and posterior oropharyngeal erythema present.     Tonsils: Tonsillar exudate present. 2+ on the right. 2+ on the left.  Cardiovascular:     Rate and Rhythm: Normal rate and regular rhythm.     Pulses: Normal pulses.  Pulmonary:     Effort: Pulmonary effort is normal.     Breath sounds: Normal breath sounds. No wheezing, rhonchi or rales.   Musculoskeletal:     Cervical back: Normal range of motion and neck supple.  Lymphadenopathy:     Cervical: Cervical adenopathy present.  Neurological:     General: No focal deficit present.     Mental Status: He is alert and oriented to person, place, and time. Mental status is at baseline.  Psychiatric:        Mood and Affect: Mood normal.        Behavior: Behavior normal.        Thought Content: Thought content normal.        Judgment: Judgment normal.    BP 125/84    Pulse 85    Temp 98.5 F (36.9 C)    Ht 5\' 9"  (1.753 m)    Wt 244 lb (110.7 kg)    SpO2 97%    BMI 36.03 kg/m  Wt Readings from Last 3 Encounters:  07/26/21 244 lb (110.7 kg)  05/11/21 230 lb (104.3 kg)  11/25/20 239 lb 3.2 oz (108.5 kg)    Health Maintenance Due  Topic Date Due   HIV Screening  Never done   Hepatitis C Screening  Never done   COVID-19 Vaccine (3 - Booster for Pfizer series) 11/18/2019   INFLUENZA VACCINE  01/23/2021    There are no preventive care reminders to display for this patient.   Lab Results  Component Value Date   TSH 0.78 01/25/2020   Lab Results  Component Value Date   WBC 6.9 07/18/2020   HGB 16.0 07/18/2020   HCT 47.5 07/18/2020   MCV 85.3 07/18/2020   PLT 221.0 07/18/2020   Lab Results  Component Value Date   NA 140 07/18/2020   K 3.6 07/18/2020   CO2 32 07/18/2020   GLUCOSE 114 (H) 07/18/2020   BUN 14 07/18/2020   CREATININE 1.02 07/18/2020   BILITOT 1.2 01/25/2020   ALKPHOS 39 12/27/2017   AST 15 01/25/2020   ALT 23 01/25/2020   PROT 6.9 01/25/2020   ALBUMIN 4.6 12/27/2017   CALCIUM 10.1 07/18/2020   GFR 91.40 07/18/2020   Lab Results  Component Value Date   CHOL 197 01/25/2020   Lab Results  Component Value Date   HDL 51 01/25/2020   Lab Results  Component Value Date   LDLCALC 114 (H) 01/25/2020   Lab Results  Component Value Date   TRIG 206 (H) 01/25/2020   Lab Results  Component Value Date   CHOLHDL 3.9 01/25/2020   No results  found for: HGBA1C     Assessment & Plan:  1. Nasal congestion 2. Bilateral otitis media, unspecified otitis media type 3. Tonsillitis Flu negative Given the severity of your symptoms (both ears concerning for ear infection and throat presentation concerning for tonsillitis), I am going to go ahead and treat you with Augmentin. You can try 1-2 more days of supportive measures before starting the antibiotics in case this is viral.  Continue supportive measures including rest, hydration, humidifier use, steam showers, warm compresses to sinuses, warm liquids with lemon and honey, and over-the-counter cough, cold, and analgesics as needed.     - amoxicillin-clavulanate (AUGMENTIN) 875-125 MG tablet; Take 1 tablet by mouth 2 (two) times daily for 10 days.  Dispense: 20 tablet; Refill: 0 - POC Influenza A&B (Binax test)   Follow-up if symptoms worsen or fail to improve.   Clayborne Dana, NP

## 2021-07-26 NOTE — Patient Instructions (Signed)
Flu negative Given the severity of your symptoms (both ears concerning for ear infection and throat presentation concerning for tonsillitis), I am going to go ahead and treat you with Augmentin. Continue supportive measures including rest, hydration, humidifier use, steam showers, warm compresses to sinuses, warm liquids with lemon and honey, and over-the-counter cough, cold, and analgesics as needed.    Please contact office for follow-up if symptoms do not improve or worsen. Seek emergency care if symptoms become severe.

## 2021-08-10 ENCOUNTER — Ambulatory Visit (INDEPENDENT_AMBULATORY_CARE_PROVIDER_SITE_OTHER): Payer: BC Managed Care – PPO | Admitting: Family

## 2021-08-10 ENCOUNTER — Encounter: Payer: Self-pay | Admitting: Family

## 2021-08-10 VITALS — BP 130/78 | HR 94 | Temp 98.7°F | Ht 70.0 in | Wt 243.2 lb

## 2021-08-10 DIAGNOSIS — Z0000 Encounter for general adult medical examination without abnormal findings: Secondary | ICD-10-CM | POA: Diagnosis not present

## 2021-08-10 DIAGNOSIS — N4282 Prostatosis syndrome: Secondary | ICD-10-CM

## 2021-08-10 DIAGNOSIS — G43009 Migraine without aura, not intractable, without status migrainosus: Secondary | ICD-10-CM

## 2021-08-10 DIAGNOSIS — Z125 Encounter for screening for malignant neoplasm of prostate: Secondary | ICD-10-CM

## 2021-08-10 DIAGNOSIS — K7689 Other specified diseases of liver: Secondary | ICD-10-CM

## 2021-08-10 DIAGNOSIS — Z13220 Encounter for screening for lipoid disorders: Secondary | ICD-10-CM | POA: Diagnosis not present

## 2021-08-10 MED ORDER — UBRELVY 100 MG PO TABS: 1.0000 | ORAL_TABLET | Freq: Every day | ORAL | 1 refills | Status: AC | PRN

## 2021-08-10 NOTE — Progress Notes (Signed)
Devin Moreno is a 41 y.o. 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 30 tablet 5   tamsulosin (FLOMAX) 0.4 MG CAPS capsule Take 0.4 mg by mouth at bedtime.     irbesartan (AVAPRO) 150 MG tablet Take 1 tablet (150 mg total) by mouth daily. (Patient not taking: Reported on 07/26/2021) 90 tablet 0   Ubrogepant (UBRELVY) 100 MG TABS Take 1 tablet by mouth daily as needed. 30 tablet 1   No current facility-administered medications for this visit.    Allergies: Patient has no known allergies.  Past Medical History:  Diagnosis Date   Hypertension     Past Surgical History:  Procedure Laterality Date   WISDOM TOOTH EXTRACTION      Family History  Problem Relation Age of Onset   Healthy Mother    Hypertension Father    Heart disease Father    Tuberculosis Maternal Grandmother    Prostate cancer Maternal Grandfather     Social History   Tobacco Use   Smoking status: Former    Packs/day: 1.00    Years: 16.00    Pack years: 16.00    Types: Cigarettes   Smokeless tobacco: Never  Substance Use Topics   Alcohol use: Yes    Alcohol/week: 7.0 standard drinks    Types: 7 Cans of beer per week    Subjective:  Presents for TOC/ yearly CPE;  History of elevated blood pressure- has been on medication in the past; off x 1  month; admits that stress level was high at time of discussion about medication;  History of ADD- has taken Vyvnase in the past/ not currently taking; working with therapist; will be trying Focalin;  [facility]- history of chronic prostatitis;  Ubrelvy prn for migraine headaches/ trying to drink more water/ take magnesium;   Review of Systems  Constitutional: Negative.   HENT: Negative.    Eyes: Negative.   Respiratory: Negative.    Cardiovascular: Negative.   Gastrointestinal: Negative.   Genitourinary: Negative.   Musculoskeletal: Negative.   Skin: Negative.   Neurological: Negative.   Endo/Heme/Allergies: Negative.   Psychiatric/Behavioral: Negative.        Objective:  Vitals:   08/10/21 1009  BP: 130/78  Pulse: 94  Temp: 98.7 F (37.1 C)  TempSrc: Oral  SpO2: 96%  Weight: 243 lb 3.2 oz (110.3 kg)  Height: $Remove'5\' 10"'KCXhdcl$  (1.778 m)    General: Well developed, well nourished, in no acute distress  Skin : Warm and dry.  Head: Normocephalic and atraumatic  Eyes: Sclera and conjunctiva clear; pupils round and reactive to light; extraocular movements intact  Ears: External normal; canals clear; tympanic membranes normal  Oropharynx: Pink, supple. No suspicious lesions  Neck: Supple without thyromegaly, adenopathy  Lungs: Respirations unlabored; clear to auscultation bilaterally without wheeze, rales, rhonchi  CVS exam: normal rate and regular rhythm.  Abdomen: Soft; nontender; nondistended; normoactive bowel sounds; no masses or hepatosplenomegaly  Musculoskeletal: No deformities; no active joint inflammation  Extremities: No edema, cyanosis, clubbing  Vessels: Symmetric bilaterally  Neurologic: Alert and oriented; speech intact; face symmetrical; moves all extremities well; CNII-XII intact without focal deficit   Assessment:  1. PE (physical exam), annual   2. Migraine without aura and without status migrainosus, not intractable   3. Lipid screening   4. Prostate  cancer screening   5. Liver cyst   6. Prostatitis syndrome     Plan:  Age appropriate preventive healthcare needs addressed; encouraged regular eye doctor and dental exams; encouraged regular exercise; he will go to [facility] for fasting labs in the next 1-2 weeks; He will start checking his blood pressure and call back if it goes back up once he starts taking Focalin; DASH diet discussed; Update abdominal ultrasound;  Okay to call for refills on Cialis and Flomax prn;   This visit occurred during the SARS-CoV-2 public health emergency.  Safety protocols were in place, including screening questions prior to the visit, additional usage of staff PPE, and extensive cleaning of exam room while observing appropriate contact time as indicated for disinfecting solutions.    No follow-ups on file.  Orders Placed This Encounter  Procedures   US Abdomen Complete    Standing Status:   Future    Standing Expiration Date:   08/10/2022    Order Specific Question:   Reason for Exam (SYMPTOM  OR DIAGNOSIS REQUIRED)    Answer:   liver cysts    Order Specific Question:   Preferred imaging location?    Answer:   [facility]   CBC with Differential/Platelet    Standing Status:   Future    Standing Expiration Date:   08/10/2022   Comp Met (CMET)    Standing Status:   Future    Standing Expiration Date:   08/10/2022   Lipid panel    Standing Status:   Future    Standing Expiration Date:   08/10/2022   TSH    Standing Status:   Future    Standing Expiration Date:   08/10/2022   PSA    Standing Status:   Future    Standing Expiration Date:   08/10/2022    Requested Prescriptions   Signed Prescriptions Disp Refills   Ubrogepant (UBRELVY) 100 MG TABS 30 tablet 1    Sig: Take 1 tablet by mouth daily as needed.

## 2021-08-10 NOTE — Patient Instructions (Signed)
Please purchase arm cuff/ [employer]; please start checking your blood pressure 2-3 x per week;    DASH Eating Plan DASH stands for Dietary Approaches to Stop Hypertension. The DASH eating plan is a healthy eating plan that has been shown to: Reduce high blood pressure (hypertension). Reduce your risk for type 2 diabetes, heart disease, and stroke. Help with weight loss. What are tips for following this plan? Reading food labels Check food labels for the amount of salt (sodium) per serving. Choose foods with less than 5 percent of the Daily Value of sodium. Generally, foods with less than 300 milligrams (mg) of sodium per serving fit into this eating plan. To find whole grains, look for the word "whole" as the first word in the ingredient list. Shopping Buy products labeled as "low-sodium" or "no salt added." Buy fresh foods. Avoid canned foods and pre-made or frozen meals. Cooking Avoid adding salt when cooking. Use salt-free seasonings or herbs instead of table salt or sea salt. Check with your health care provider or pharmacist before using salt substitutes. Do not fry foods. Cook foods using healthy methods such as baking, boiling, grilling, roasting, and broiling instead. Cook with heart-healthy oils, such as olive, canola, avocado, soybean, or sunflower oil. Meal planning  Eat a balanced diet that includes: 4 or more servings of fruits and 4 or more servings of vegetables each day. Try to fill one-half of your plate with fruits and vegetables. 6-8 servings of whole grains each day. Less than 6 oz (170 g) of lean meat, poultry, or fish each day. A 3-oz (85-g) serving of meat is about the same size as a deck of cards. One egg equals 1 oz (28 g). 2-3 servings of low-fat dairy each day. One serving is 1 cup (237 mL). 1 serving of nuts, seeds, or beans 5 times each week. 2-3 servings of heart-healthy fats. Healthy fats called omega-3 fatty acids are found in foods such as walnuts,  flaxseeds, fortified milks, and eggs. These fats are also found in cold-water fish, such as sardines, salmon, and mackerel. Limit how much you eat of: Canned or prepackaged foods. Food that is high in trans fat, such as some fried foods. Food that is high in saturated fat, such as fatty meat. Desserts and other sweets, sugary drinks, and other foods with added sugar. Full-fat dairy products. Do not salt foods before eating. Do not eat more than 4 egg yolks a week. Try to eat at least 2 vegetarian meals a week. Eat more home-cooked food and less restaurant, buffet, and fast food. Lifestyle When eating at a restaurant, ask that your food be prepared with less salt or no salt, if possible. If you drink alcohol: Limit how much you use to: 0-1 drink a day for women who are not pregnant. 0-2 drinks a day for men. Be aware of how much alcohol is in your drink. In the [place] one drink equals one 12 oz bottle of beer (355 mL), one 5 oz glass of wine (148 mL), or one 1 oz glass of hard liquor (44 mL). General information Avoid eating more than 2,300 mg of salt a day. If you have hypertension, you may need to reduce your sodium intake to 1,500 mg a day. Work with your health care provider to maintain a healthy body weight or to lose weight. Ask what an ideal weight is for you. Get at least 30 minutes of exercise that causes your heart to beat faster (aerobic exercise) most days  of the week. Activities may include walking, swimming, or biking. Work with your health care provider or dietitian to adjust your eating plan to your individual calorie needs. What foods should I eat? Fruits All fresh, dried, or frozen fruit. Canned fruit in natural juice (without added sugar). Vegetables Fresh or frozen vegetables (raw, steamed, roasted, or grilled). Low-sodium or reduced-sodium tomato and vegetable juice. Low-sodium or reduced-sodium tomato sauce and tomato paste. Low-sodium or reduced-sodium canned  vegetables. Grains Whole-grain or whole-wheat bread. Whole-grain or whole-wheat pasta. Brown rice. [place]. Bulgur. Whole-grain and low-sodium cereals. Pita bread. Low-fat, low-sodium crackers. Whole-wheat flour tortillas. Meats and other proteins Skinless chicken or [place]. Ground chicken or [place]. Pork with fat trimmed off. Fish and seafood. Egg whites. Dried beans, peas, or lentils. Unsalted nuts, nut butters, and seeds. Unsalted canned beans. Lean cuts of beef with fat trimmed off. Low-sodium, lean precooked or cured meat, such as sausages or meat loaves. Dairy Low-fat (1%) or fat-free (skim) milk. Reduced-fat, low-fat, or fat-free cheeses. Nonfat, low-sodium ricotta or cottage cheese. Low-fat or nonfat yogurt. Low-fat, low-sodium cheese. Fats and oils Soft margarine without trans fats. Vegetable oil. Reduced-fat, low-fat, or light mayonnaise and salad dressings (reduced-sodium). Canola, safflower, olive, avocado, soybean, and sunflower oils. Avocado. Seasonings and condiments Herbs. Spices. Seasoning mixes without salt. Other foods Unsalted popcorn and pretzels. Fat-free sweets. The items listed above may not be a complete list of foods and beverages you can eat. Contact a dietitian for more information. What foods should I avoid? Fruits Canned fruit in a light or heavy syrup. Fried fruit. Fruit in cream or butter sauce. Vegetables Creamed or fried vegetables. Vegetables in a cheese sauce. Regular canned vegetables (not low-sodium or reduced-sodium). Regular canned tomato sauce and paste (not low-sodium or reduced-sodium). Regular tomato and vegetable juice (not low-sodium or reduced-sodium). Angie Fava. Olives. Grains Baked goods made with fat, such as croissants, muffins, or some breads. Dry pasta or rice meal packs. Meats and other proteins Fatty cuts of meat. Ribs. Fried meat. Berniece Salines. Bologna, salami, and other precooked or cured meats, such as sausages or meat loaves. Fat from  the back of a pig (fatback). Bratwurst. Salted nuts and seeds. Canned beans with added salt. Canned or smoked fish. Whole eggs or egg yolks. Chicken or [place] with skin. Dairy Whole or 2% milk, cream, and half-and-half. Whole or full-fat cream cheese. Whole-fat or sweetened yogurt. Full-fat cheese. Nondairy creamers. Whipped toppings. Processed cheese and cheese spreads. Fats and oils Butter. Stick margarine. Lard. Shortening. Ghee. Bacon fat. Tropical oils, such as coconut, palm kernel, or palm oil. Seasonings and condiments Onion salt, garlic salt, seasoned salt, table salt, and sea salt. Worcestershire sauce. Tartar sauce. Barbecue sauce. Teriyaki sauce. Soy sauce, including reduced-sodium. Steak sauce. Canned and packaged gravies. Fish sauce. Oyster sauce. Cocktail sauce. Store-bought horseradish. Ketchup. Mustard. Meat flavorings and tenderizers. Bouillon cubes. Hot sauces. Pre-made or packaged marinades. Pre-made or packaged taco seasonings. Relishes. Regular salad dressings. Other foods Salted popcorn and pretzels. The items listed above may not be a complete list of foods and beverages you should avoid. Contact a dietitian for more information. Where to find more information National Heart, Lung, and Blood Institute: https://wilson-eaton.com/ American Heart Association: www.heart.org Academy of Nutrition and Dietetics: www.eatright.[place]: www.kidney.org Summary The DASH eating plan is a healthy eating plan that has been shown to reduce high blood pressure (hypertension). It may also reduce your risk for type 2 diabetes, heart disease, and stroke. When on the DASH eating plan, aim  to eat more fresh fruits and vegetables, whole grains, lean proteins, low-fat dairy, and heart-healthy fats. With the DASH eating plan, you should limit salt (sodium) intake to 2,300 mg a day. If you have hypertension, you may need to reduce your sodium intake to 1,500 mg a day. Work with your  health care provider or dietitian to adjust your eating plan to your individual calorie needs. This information is not intended to replace advice given to you by your health care provider. Make sure you discuss any questions you have with your health care provider. Document Revised: 05/15/2019 Document Reviewed: 05/15/2019 Elsevier Patient Education  2022 [employer].

## 2021-08-15 NOTE — Telephone Encounter (Signed)
Opened in error

## 2021-08-21 ENCOUNTER — Ambulatory Visit
Admission: RE | Admit: 2021-08-21 | Discharge: 2021-08-21 | Disposition: A | Discharge status: Home / Self Care | Payer: BC Managed Care – PPO | Source: Ambulatory Visit | Attending: Family | Admitting: Family

## 2021-08-21 DIAGNOSIS — K7689 Other specified diseases of liver: Secondary | ICD-10-CM

## 2021-08-22 ENCOUNTER — Other Ambulatory Visit: Payer: Self-pay | Admitting: Family

## 2021-08-22 DIAGNOSIS — K769 Liver disease, unspecified: Secondary | ICD-10-CM

## 2021-09-06 ENCOUNTER — Other Ambulatory Visit: Payer: Self-pay

## 2021-09-06 ENCOUNTER — Ambulatory Visit
Admission: RE | Admit: 2021-09-06 | Discharge: 2021-09-06 | Disposition: A | Discharge status: Home / Self Care | Payer: BC Managed Care – PPO | Source: Ambulatory Visit | Attending: Family | Admitting: Family

## 2021-09-06 DIAGNOSIS — K769 Liver disease, unspecified: Secondary | ICD-10-CM

## 2021-09-06 MED ORDER — GADOBENATE DIMEGLUMINE 529 MG/ML IV SOLN
20.0000 mL | Freq: Once | INTRAVENOUS | Status: AC | PRN
  Administered 2021-09-06: 20 mL via INTRAVENOUS

## 2021-09-08 ENCOUNTER — Encounter: Payer: Self-pay | Admitting: Family

## 2021-12-21 ENCOUNTER — Encounter: Payer: Self-pay | Admitting: Family

## 2021-12-22 ENCOUNTER — Other Ambulatory Visit: Payer: Self-pay | Admitting: Family

## 2021-12-22 MED ORDER — VALSARTAN 80 MG PO TABS: 80.0000 mg | ORAL_TABLET | Freq: Every day | ORAL | 0 refills | Status: DC

## 2022-01-26 ENCOUNTER — Ambulatory Visit: Payer: BC Managed Care – PPO | Admitting: Family

## 2022-01-26 VITALS — BP 132/82 | HR 82 | Temp 98.2°F | Resp 18 | Ht 70.0 in | Wt 244.4 lb

## 2022-01-26 DIAGNOSIS — Z125 Encounter for screening for malignant neoplasm of prostate: Secondary | ICD-10-CM

## 2022-01-26 DIAGNOSIS — Z13220 Encounter for screening for lipoid disorders: Secondary | ICD-10-CM | POA: Diagnosis not present

## 2022-01-26 DIAGNOSIS — I10 Essential (primary) hypertension: Secondary | ICD-10-CM

## 2022-01-26 LAB — LIPID PANEL
Cholesterol: 200 mg/dL (ref 0–200)
HDL: 47.1 mg/dL (ref >39.00)
LDL Cholesterol: 137 mg/dL — ABNORMAL HIGH (ref 0–99)
NonHDL: 153.02
Total CHOL/HDL Ratio: 4
Triglycerides: 81 mg/dL (ref 0.0–149.0)
VLDL: 16.2 mg/dL (ref 0.0–40.0)

## 2022-01-26 LAB — COMPREHENSIVE METABOLIC PANEL
ALT: 30 U/L (ref 0–53)
AST: 15 U/L (ref 0–37)
Albumin: 4.8 g/dL (ref 3.5–5.2)
Alkaline Phosphatase: 40 U/L (ref 39–117)
BUN: 20 mg/dL (ref 6–23)
CO2: 32 mEq/L (ref 19–32)
Calcium: 9.6 mg/dL (ref 8.4–10.5)
Chloride: 101 mEq/L (ref 96–112)
Creatinine, Ser: 0.98 mg/dL (ref 0.40–1.50)
GFR: 94.88 mL/min (ref >60.00)
Glucose, Bld: 95 mg/dL (ref 70–99)
Potassium: 4.8 mEq/L (ref 3.5–5.1)
Sodium: 139 mEq/L (ref 135–145)
Total Bilirubin: 1.8 mg/dL — ABNORMAL HIGH (ref 0.2–1.2)
Total Protein: 6.9 g/dL (ref 6.0–8.3)

## 2022-01-26 LAB — CBC WITH DIFFERENTIAL/PLATELET
Basophils Absolute: 0 10*3/uL (ref 0.0–0.1)
Basophils Relative: 0.3 % (ref 0.0–3.0)
Eosinophils Absolute: 0.1 10*3/uL (ref 0.0–0.7)
Eosinophils Relative: 2.1 % (ref 0.0–5.0)
HCT: 45.8 % (ref 39.0–52.0)
Hemoglobin: 15.2 g/dL (ref 13.0–17.0)
Lymphocytes Relative: 34.1 % (ref 12.0–46.0)
Lymphs Abs: 1.6 10*3/uL (ref 0.7–4.0)
MCHC: 33.2 g/dL (ref 30.0–36.0)
MCV: 86 fl (ref 78.0–100.0)
Monocytes Absolute: 0.4 10*3/uL (ref 0.1–1.0)
Monocytes Relative: 9.1 % (ref 3.0–12.0)
Neutro Abs: 2.6 10*3/uL (ref 1.4–7.7)
Neutrophils Relative %: 54.4 % (ref 43.0–77.0)
Platelets: 179 10*3/uL (ref 150.0–400.0)
RBC: 5.32 Mil/uL (ref 4.22–5.81)
RDW: 13.5 % (ref 11.5–15.5)
WBC: 4.7 10*3/uL (ref 4.0–10.5)

## 2022-01-26 LAB — PSA: PSA: 0.31 ng/mL (ref 0.10–4.00)

## 2022-01-26 MED ORDER — TADALAFIL 5 MG PO TABS
5.0000 mg | ORAL_TABLET | Freq: Every day | ORAL | 5 refills | Status: DC
Start: 2022-01-26 — End: 2022-09-06

## 2022-01-26 MED ORDER — VALSARTAN 80 MG PO TABS: 80.0000 mg | ORAL_TABLET | Freq: Every day | ORAL | 3 refills | Status: DC

## 2022-01-26 NOTE — Progress Notes (Signed)
Devin Moreno is a 41 y.o. 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/2020   Acute bronchitis due to COVID-19 virus 07/19/2020   Cough 07/18/2020   GAD (generalized anxiety disorder) 01/28/2020   Vitamin D deficiency disease 01/26/2020   Routine general medical examination at a health care facility 01/25/2020   Essential hypertension 01/25/2020   Migraine without aura and without status migrainosus, not intractable 01/25/2020   Routine adult health maintenance 10/11/2016   Hemorrhoid 10/11/2016    Current Outpatient Medications  Medication Sig Dispense Refill   Dexmethylphenidate HCl (FOCALIN XR) 25 MG CP24 Take by mouth.     tamsulosin (FLOMAX) 0.4 MG CAPS capsule Take 0.4 mg by mouth at bedtime. Using prn     Ubrogepant (UBRELVY) 100 MG TABS Take 1 tablet by mouth daily as needed. 30 tablet 1   tadalafil (CIALIS) 5 MG tablet Take 1 tablet (5 mg total) by mouth daily. 30 tablet 5   valsartan (DIOVAN) 80 MG tablet Take 1 tablet (80 mg total) by mouth daily. 90 tablet 3   No current facility-administered medications for this visit.    Allergies: Patient has no known allergies.  Past Medical History:  Diagnosis Date   Hypertension     Past Surgical History:  Procedure Laterality Date   WISDOM TOOTH EXTRACTION      Family History  Problem Relation Age of Onset   Healthy Mother    Hypertension Father    Heart disease Father    Tuberculosis Maternal Grandmother    Prostate cancer Maternal Grandfather     Social History   Tobacco Use   Smoking status: Former    Packs/day: 1.00    Years: 16.00    Total pack years: 16.00    Types: Cigarettes   Smokeless tobacco: Never  Substance Use Topics   Alcohol use: Yes    Alcohol/week: 7.0 standard drinks of alcohol    Types: 7 Cans of beer per week    Subjective:   Follow up on hypertension; doing well on current regimen; Denies  any chest pain, shortness of breath, blurred vision or headache. Needs to get fasting labs updated today;     Objective:  Vitals:   01/26/22 0858  BP: 132/82  Pulse: 82  Resp: 18  Temp: 98.2 F (36.8 C)  TempSrc: Temporal  SpO2: 97%  Weight: 244 lb 6.4 oz (110.9 kg)  Height: _0  (1.778 m)    General: Well developed, well nourished, in no acute distress  Skin : Warm and dry.  Head: Normocephalic and atraumatic  Eyes: Sclera and conjunctiva clear; pupils round and reactive to light; extraocular movements intact  Ears: External normal; canals clear; tympanic membranes normal  Oropharynx: Pink, supple. No suspicious lesions  Neck: Supple without thyromegaly, adenopathy  Lungs: Respirations unlabored; clear to auscultation bilaterally without wheeze, rales, rhonchi  CVS exam: normal rate and regular rhythm.  Neurologic: Alert and oriented; speech intact; face symmetrical; moves all extremities well; CNII-XII intact without focal deficit   Assessment:  1. Primary hypertension   2. Lipid screening   3. Prostate cancer screening     Plan:  Stable; good response to medication; refill updated; fasting labs updated; Follow up for CPE in 6 months, sooner prn.   Return for CPE after 08/11/2022.  Orders Placed This Encounter  Procedures   CBC with Differential/Platelet   Comp Met (CMET)   Lipid  panel   PSA    Requested Prescriptions   Signed Prescriptions Disp Refills   tadalafil (CIALIS) 5 MG tablet 30 tablet 5    Sig: Take 1 tablet (5 mg total) by mouth daily.   valsartan (DIOVAN) 80 MG tablet 90 tablet 3    Sig: Take 1 tablet (80 mg total) by mouth daily.

## 2022-05-07 ENCOUNTER — Encounter: Payer: Self-pay | Admitting: Family

## 2022-05-07 NOTE — Telephone Encounter (Signed)
I have called pt and he was wanting a VV. I have informed him that it would be challenging to do a swab and look in his throat via a camera. He stated understanding and will come in for a OV tomorrow at 2:40 pm.  FYI to provider.

## 2022-05-08 ENCOUNTER — Encounter: Payer: Self-pay | Admitting: Family

## 2022-05-08 ENCOUNTER — Ambulatory Visit: Payer: BC Managed Care – PPO | Admitting: Family

## 2022-05-08 VITALS — BP 118/72 | HR 85 | Temp 98.5°F | Resp 18 | Ht 70.0 in | Wt 249.6 lb

## 2022-05-08 DIAGNOSIS — J029 Acute pharyngitis, unspecified: Secondary | ICD-10-CM

## 2022-05-08 DIAGNOSIS — J020 Streptococcal pharyngitis: Secondary | ICD-10-CM | POA: Diagnosis not present

## 2022-05-08 LAB — POCT RAPID STREP A (OFFICE): Rapid Strep A Screen: POSITIVE — AB

## 2022-05-08 MED ORDER — METHYLPREDNISOLONE ACETATE 40 MG/ML IJ SUSP
40.0000 mg | Freq: Once | INTRAMUSCULAR | Status: AC
  Administered 2022-05-08: 40 mg via INTRAMUSCULAR

## 2022-05-08 MED ORDER — AMOXICILLIN-POT CLAVULANATE 875-125 MG PO TABS: 1.0000 | ORAL_TABLET | Freq: Two times a day (BID) | ORAL | 0 refills | Status: AC

## 2022-05-08 MED ORDER — LIDOCAINE VISCOUS HCL 2 % MT SOLN: 15.0000 mL | Freq: Four times a day (QID) | OROMUCOSAL | 0 refills | Status: DC | PRN

## 2022-05-08 NOTE — Progress Notes (Signed)
Devin Moreno is a 41 y.o. 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/2020   Acute bronchitis due to COVID-19 virus 07/19/2020   Cough 07/18/2020   GAD (generalized anxiety disorder) 01/28/2020   Vitamin D deficiency disease 01/26/2020   Routine general medical examination at a health care facility 01/25/2020   Essential hypertension 01/25/2020   Migraine without aura and without status migrainosus, not intractable 01/25/2020   Routine adult health maintenance 10/11/2016   Hemorrhoid 10/11/2016    Current Outpatient Medications  Medication Sig Dispense Refill   amoxicillin-clavulanate (AUGMENTIN) 875-125 MG tablet Take 1 tablet by mouth 2 (two) times daily for 10 days. 20 tablet 0   lidocaine (XYLOCAINE) 2 % solution Use as directed 15 mLs in the mouth or throat every 6 (six) hours as needed for mouth pain (Swish and swallow). 100 mL 0   Dexmethylphenidate HCl (FOCALIN XR) 25 MG CP24 Take by mouth.     tadalafil (CIALIS) 5 MG tablet Take 1 tablet (5 mg total) by mouth daily. 30 tablet 5   tamsulosin (FLOMAX) 0.4 MG CAPS capsule Take 0.4 mg by mouth at bedtime. Using prn     Ubrogepant (UBRELVY) 100 MG TABS Take 1 tablet by mouth daily as needed. 30 tablet 1   valsartan (DIOVAN) 80 MG tablet Take 1 tablet (80 mg total) by mouth daily. 90 tablet 3   No current facility-administered medications for this visit.    Allergies: Patient has no known allergies.  Past Medical History:  Diagnosis Date   Hypertension     Past Surgical History:  Procedure Laterality Date   WISDOM TOOTH EXTRACTION      Family History  Problem Relation Age of Onset   Healthy Mother    Hypertension Father    Heart disease Father    Tuberculosis Maternal Grandmother    Prostate cancer Maternal Grandfather     Social History   Tobacco Use   Smoking status: Former    Packs/day: 1.00    Years: 16.00     Total pack years: 16.00    Types: Cigarettes   Smokeless tobacco: Never  Substance Use Topics   Alcohol use: Yes    Alcohol/week: 7.0 standard drinks of alcohol    Types: 7 Cans of beer per week    Subjective:   Started with sore throat/ cough on Friday; + fever; children has been sick; notes that tonsils are extremely swollen/ red; had to sit up last night because he felt like his tonsils were causing his airway to close if he laid down;    Objective:  Vitals:   05/08/22 1447  BP: 118/72  Pulse: 85  Resp: 18  Temp: 98.5 F (36.9 C)  TempSrc: Oral  SpO2: 98%  Weight: 249 lb 9.6 oz (113.2 kg)  Height: 5\' 10"  (1.778 m)    General: Well developed, well nourished, in no acute distress  Skin : Warm and dry.  Head: Normocephalic and atraumatic  Eyes: Sclera and conjunctiva clear; pupils round and reactive to light; extraocular movements intact  Ears: External normal; canals clear; tympanic membranes normal  Oropharynx: Pink, supple. Tonsils enlarged, erythematous, exudate noted;  Neck: Supple without thyromegaly, adenopathy  Lungs: Respirations unlabored;  Neurologic: Alert and oriented; speech intact; face symmetrical; moves all extremities well; CNII-XII intact without focal deficit   Assessment:  1. Sore throat   2. Strep throat  Plan:  Depo-Medrol IM 40 mg given in office due to size of tonsils; Rx for Augmentin 875 mg bid x 10 days; Rx for Viscous Lidocaine for symptom relief; increase fluids, rest and follow up worse, no better.   No follow-ups on file.  Orders Placed This Encounter  Procedures   POCT rapid strep A    Requested Prescriptions   Signed Prescriptions Disp Refills   amoxicillin-clavulanate (AUGMENTIN) 875-125 MG tablet 20 tablet 0    Sig: Take 1 tablet by mouth 2 (two) times daily for 10 days.   lidocaine (XYLOCAINE) 2 % solution 100 mL 0    Sig: Use as directed 15 mLs in the mouth or throat every 6 (six) hours as needed for mouth pain (Swish and  swallow).

## 2022-05-22 IMAGING — US US ABDOMEN COMPLETE
1 series · 13 of 25 positions shown · non-contrast
Comparison: Ultrasound 01/15/2018.  CT 01/03/2018.

CLINICAL DATA: History of liver cyst.

EXAM:
ABDOMEN ULTRASOUND COMPLETE

[Series 1: us abdomen complete · 0.20mm/px · 13 of 85 slices shown]
[im 1/85]
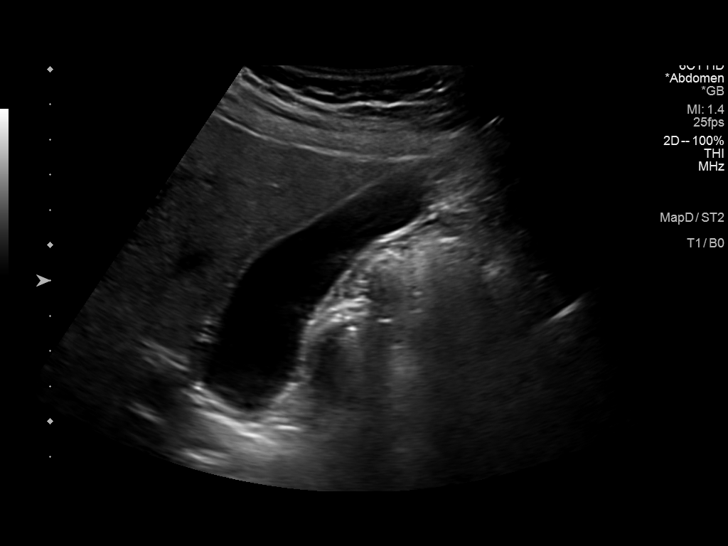
[im 8/85]
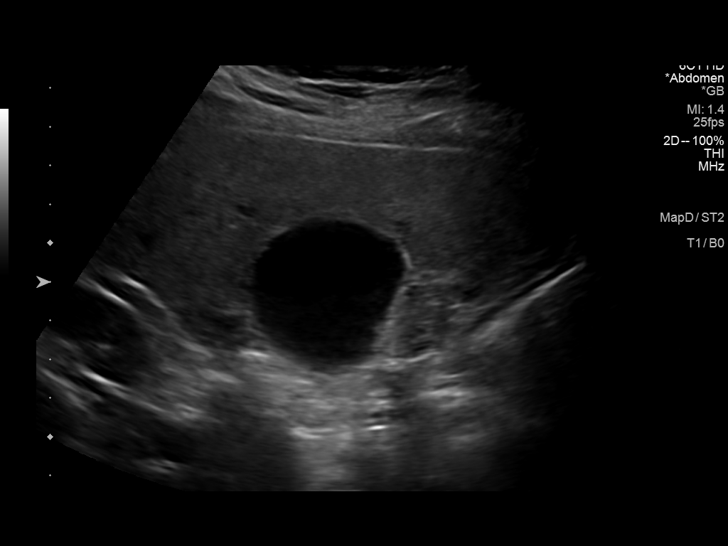
[im 15/85]
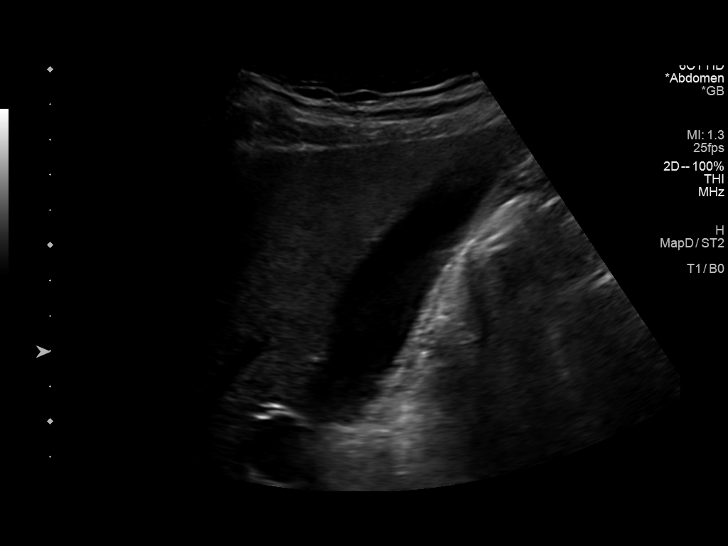
[im 22/85]
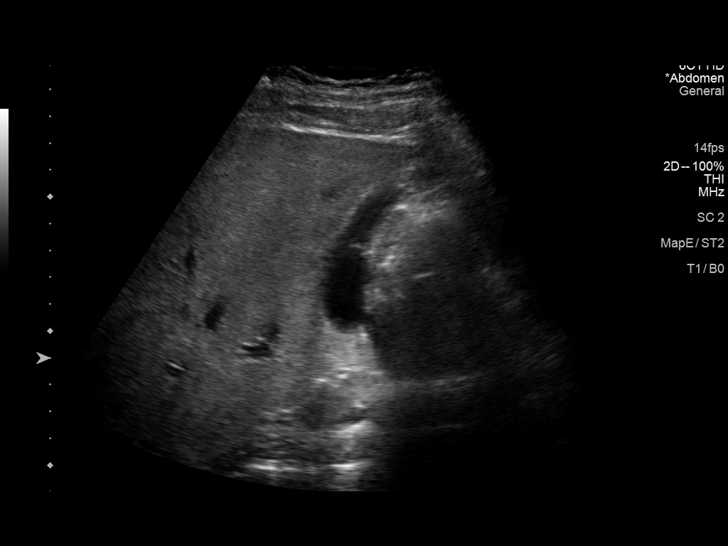
[im 29/85]
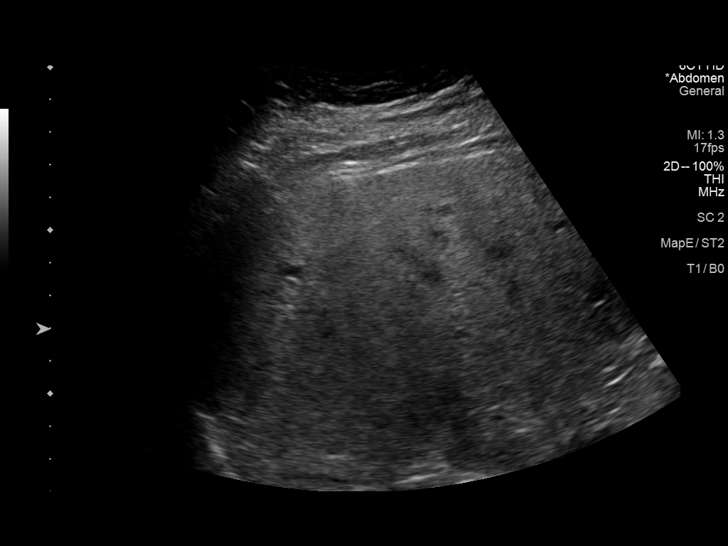
[im 36/85]
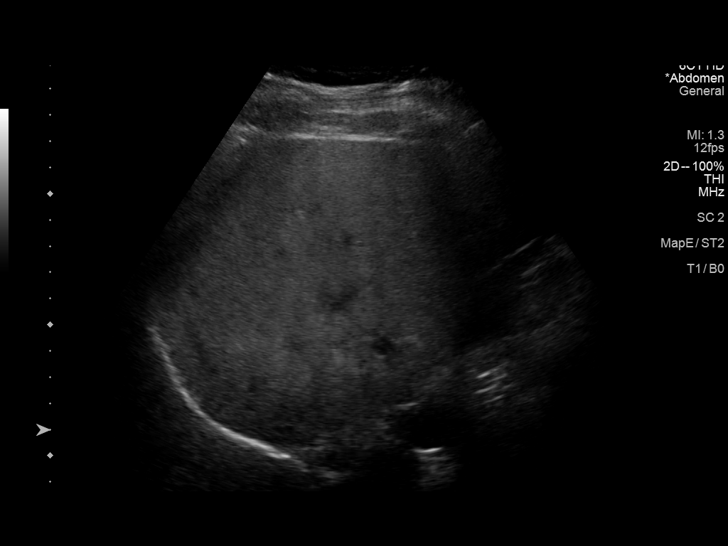
[im 43/85]
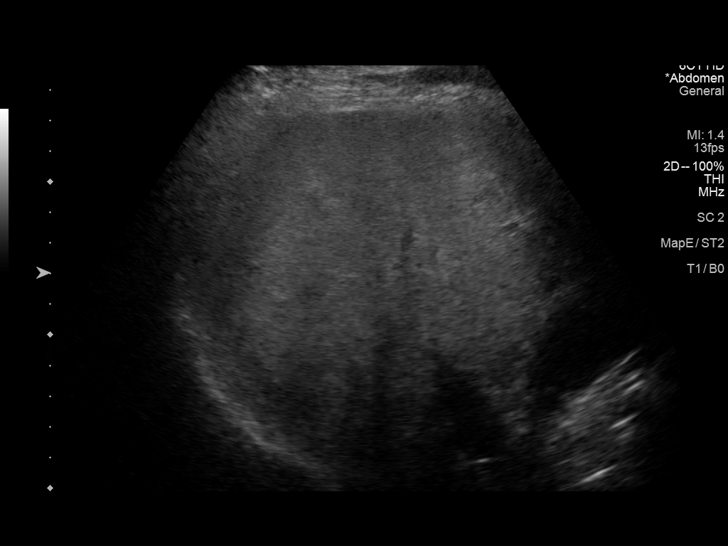
[im 50/85]
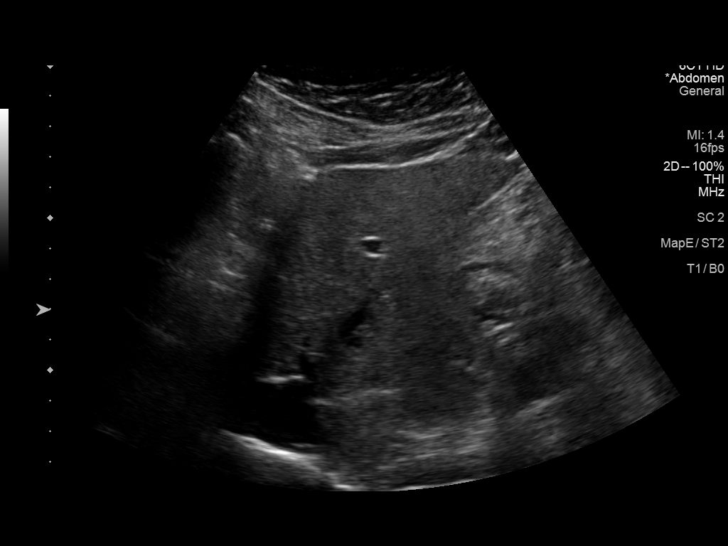
[im 57/85]
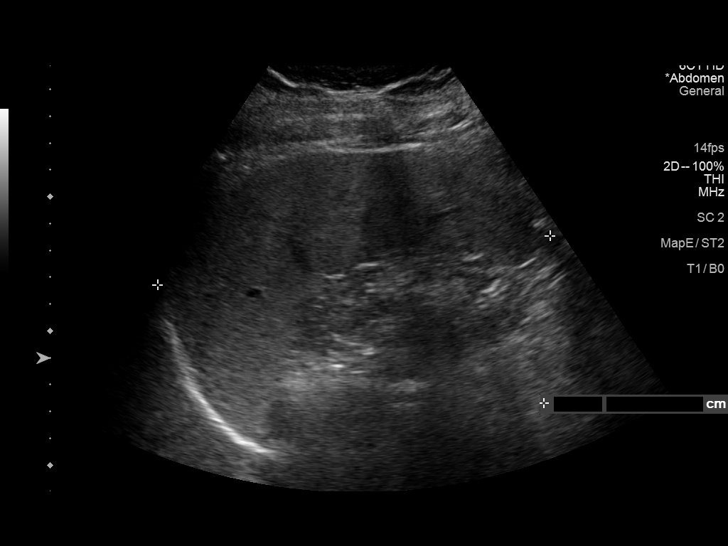
[im 64/85]
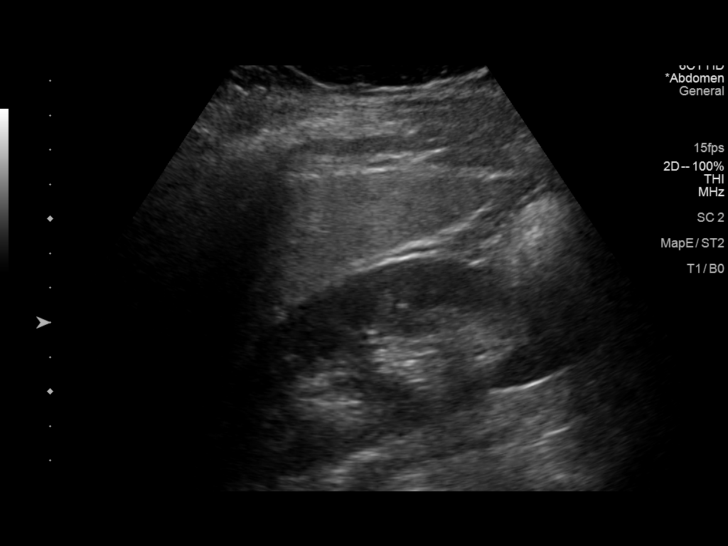
[im 71/85]
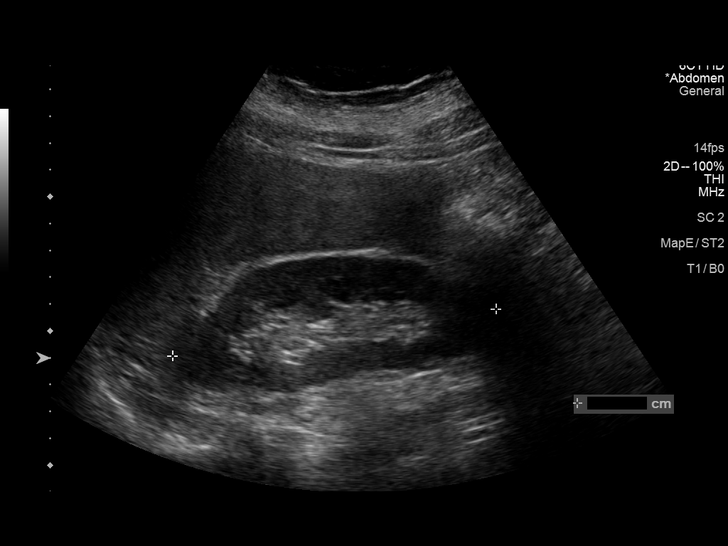
[im 78/85]
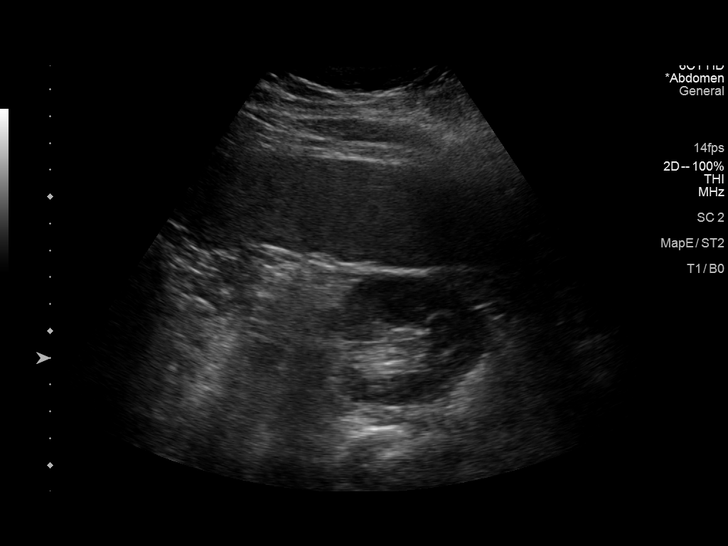
[im 85/85]
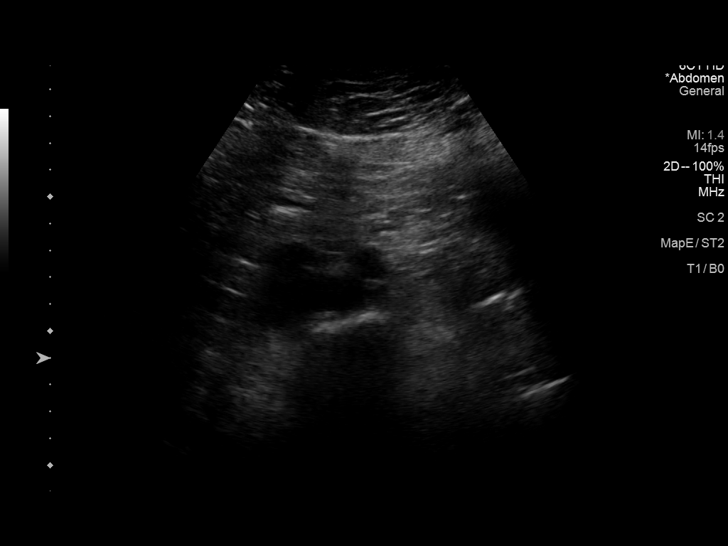

[13 of 25 positions shown; findings below may reference images not displayed]

FINDINGS: Gallbladder: 7 mm gallbladder polyp again noted. Mild amount of
sludge cannot be excluded. No gallstones. No gallbladder wall
thickening. Negative Murphy sign.

Common bile duct: Diameter: 3.1 mm

Liver: Increased hepatic echogenicity consistent with fatty
infiltration and or hepatocellular disease. Two tiny
benign-appearing hepatic cysts again noted in the right lobe of the
liver. A solid 1.6 cm and a solid 1.3 cm nodular focus noted within
the right lobe of the liver. MRI of the abdomen is suggested for
further evaluation. Portal vein is patent on color Doppler imaging
with normal direction of blood flow towards the liver.

IVC: No abnormality visualized.

Pancreas: Pancreas is poorly visualized due to overlying bowel gas.

Spleen: Spleen measures 14.7 cm with a volume of 547 cc.

Right Kidney: Length: 13.6 cm. Echogenicity within normal limits. No
mass or hydronephrosis visualized.

Left Kidney: Length: 12.2 cm. Echogenicity within normal limits. No
mass or hydronephrosis visualized.

Abdominal aorta: No aneurysm visualized.

Other findings: None.
IMPRESSION: 1. 7 mm gallbladder polyp again noted. Mild amount of gallbladder
sludge cannot be excluded. No gallstones or biliary distention.

2. Increased hepatic echogenicity consistent with fatty infiltration
and or hepatocellular disease. Two tiny benign-appearing hepatic
cysts again noted in the right lobe of the liver. A solid 1.6 cm and
a solid 1.3 cm nodular focus is noted the right lobe of the liver.
MRI of the abdomen is suggested for further evaluation.

3.  Spleen is prominent at 14.7 cm.

## 2022-06-07 IMAGING — MR MR ABDOMEN WO/W CM
11 of 17 series · 28 of 48 positions shown · IV contrast (multihance)
Comparison: Ultrasound August 21, 2021 and CT January 03, 2018.

CLINICAL DATA: Further evaluation of possible solid hepatic lesions
seen on prior ultrasound.

EXAM:
MRI ABDOMEN WITHOUT AND WITH CONTRAST
TECHNIQUE: Multiplanar multisequence MR imaging of the abdomen was performed
both before and after the administration of intravenous contrast.
CONTRAST:  20mL MULTIHANCE GADOBENATE DIMEGLUMINE 529 MG/ML IV SOLN

[Series 3: cor haste · coronal · 5.0mm · 0.78mm/px · 2 of 35 slices shown]
[im 1/35]
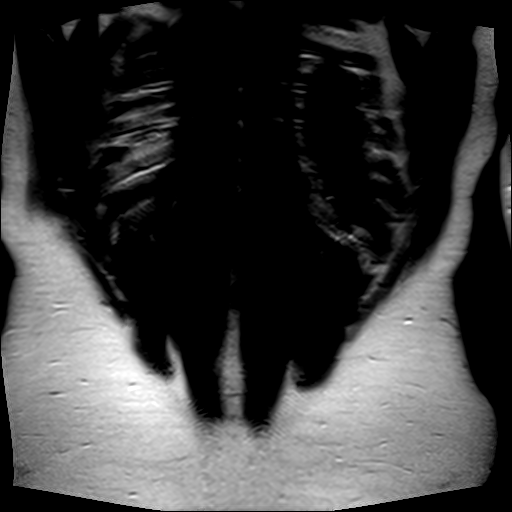
[im 35/35]
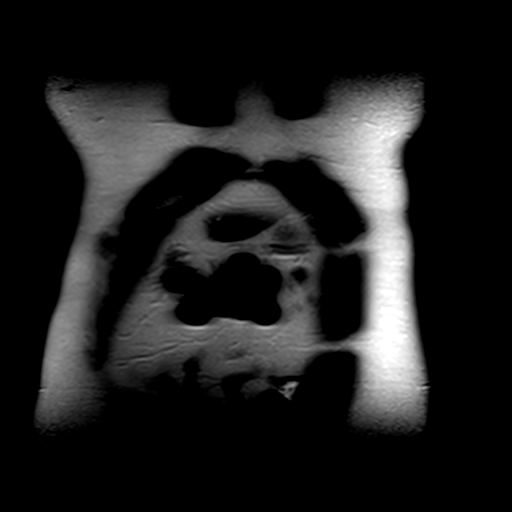

[Series 4: axial haste · axial · 6.0mm · 0.78mm/px · z∈[-120,+104]mm · 2 of 35 slices shown]
[im 1/35]
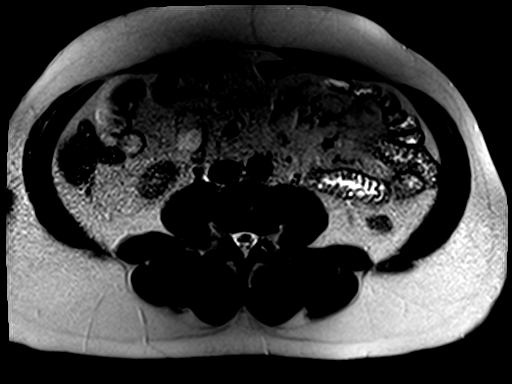
[im 35/35]
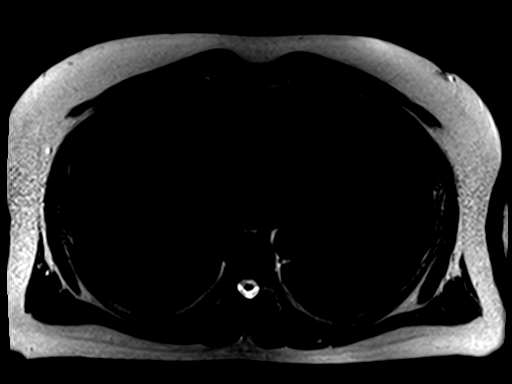

[Series 5: T1 · axial · 6.0mm · 0.78mm/px · z∈[-114,+98]mm · 3 of 66 slices shown]
[im 1/66]
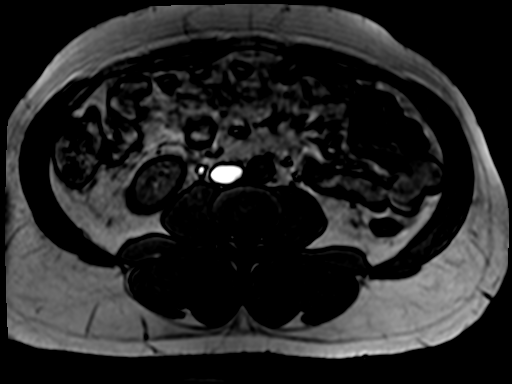
[im 33/66]
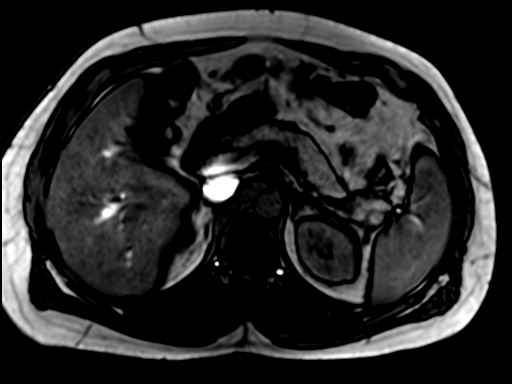
[im 66/66]
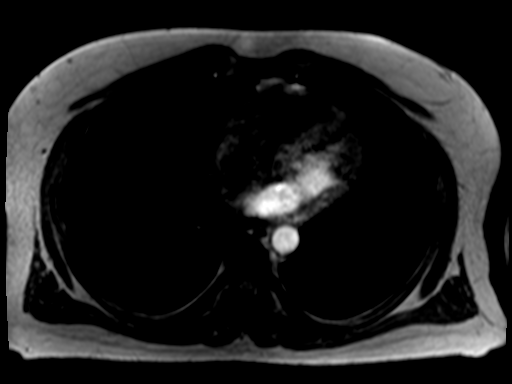

[Series 6: bSSFP · axial · 4.0mm · 0.78mm/px · z∈[-148,+108]mm · 3 of 65 slices shown]
[im 1/65]
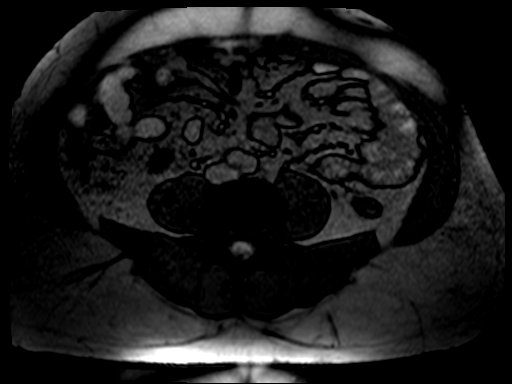
[im 33/65]
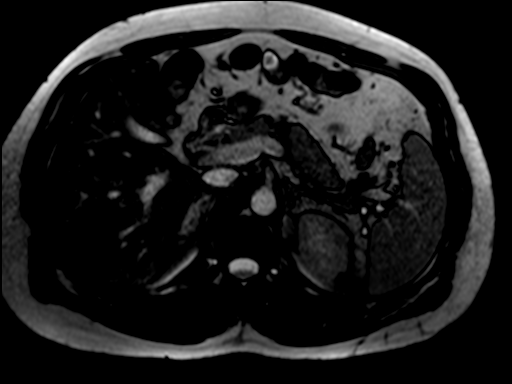
[im 65/65]
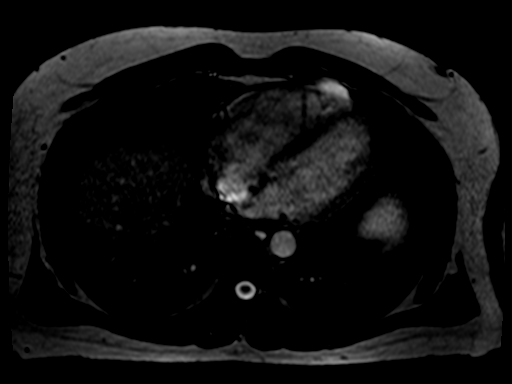

[Series 7: T2 fat-sat · axial · 6.0mm · 1.25mm/px · z∈[-122,+137]mm · 2 of 37 slices shown]
[im 1/37]
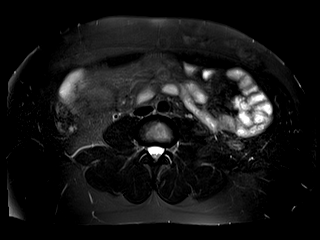
[im 37/37]
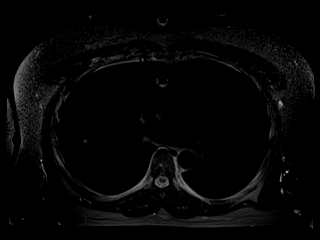

[Series 8: ep2d_[id number]_trig · axial · 6.0mm · 2.08mm/px · z∈[-122,+137]mm · 5 of 111 slices shown]
[im 1/111]
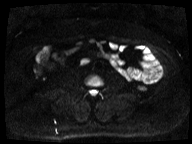
[im 28/111]
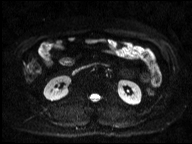
[im 56/111]
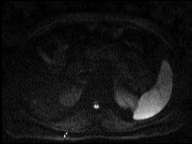
[im 83/111]
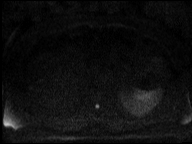
[im 111/111]
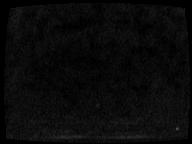

[Series 9: [id number]_trig_adc · axial · 6.0mm · 2.08mm/px · 1 of 37 slices shown]
[im 1/37]
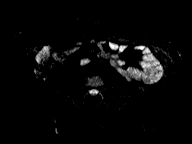

[Series 10: T1 dynamic · axial · non-contrast · 2.5mm · 0.78mm/px · z∈[-138,+99]mm · 3 of 96 slices shown]
[im 1/96]
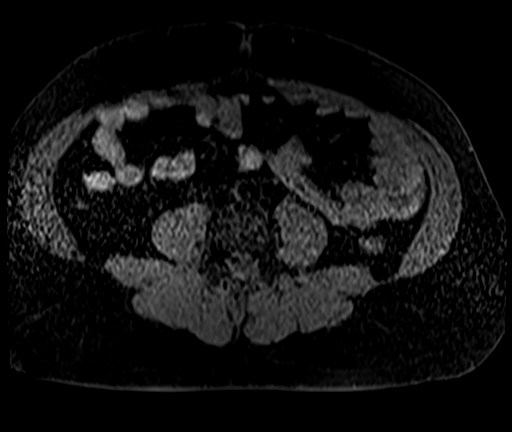
[im 48/96]
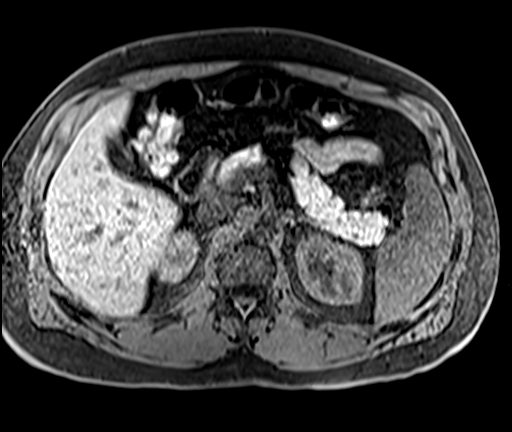
[im 96/96]
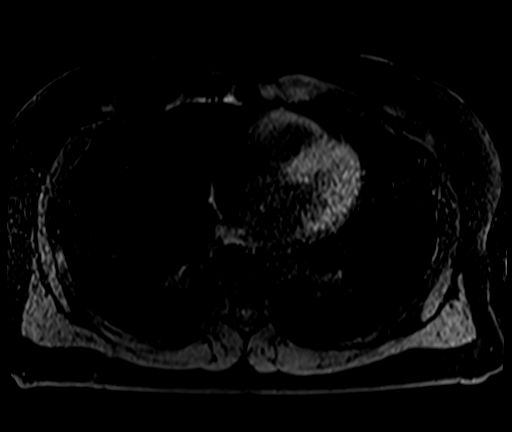

[Series 11: T1 dynamic post-contrast · axial · 2.5mm · 0.78mm/px · z∈[-138,+99]mm · 3 of 96 slices shown (1 of 3)]
[im 1/96]
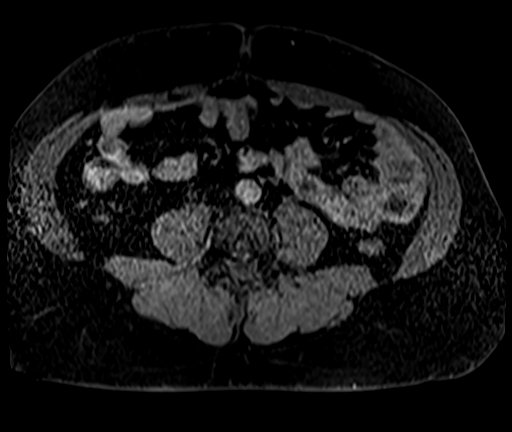
[im 48/96]
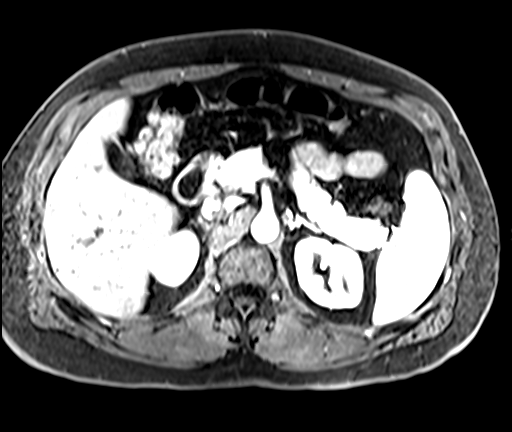
[im 96/96]
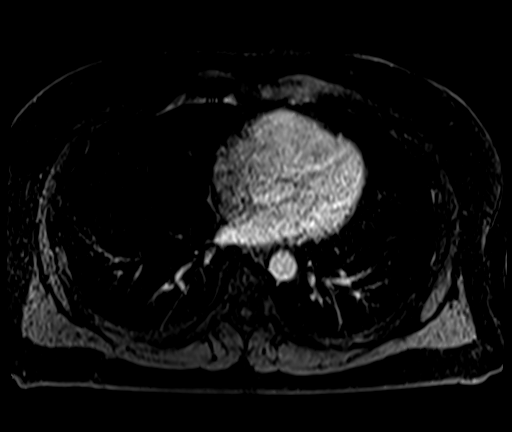

[Series 12: T1 dynamic post-contrast · axial · 2.5mm · 0.78mm/px · z∈[-138,+99]mm · 3 of 96 slices shown (2 of 3)]
[im 1/96]
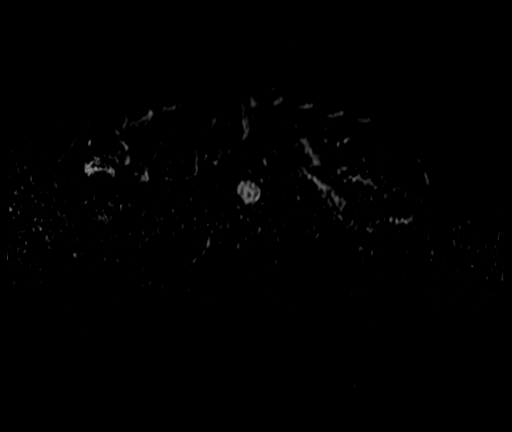
[im 48/96]
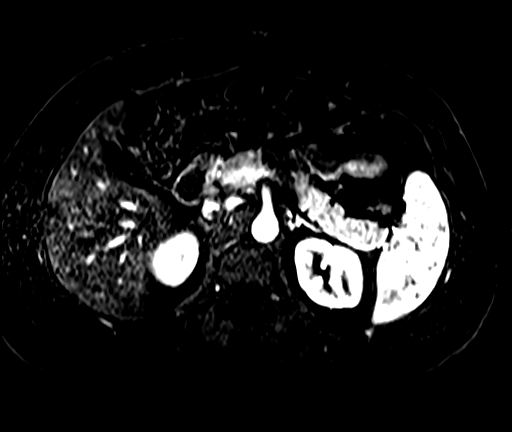
[im 96/96]
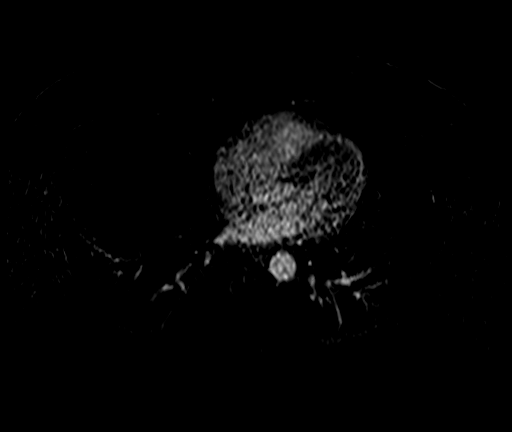

[Series 13: T1 dynamic post-contrast · axial · 2.5mm · 0.78mm/px · 1 of 96 slices shown (3 of 3)]
[im 1/96]
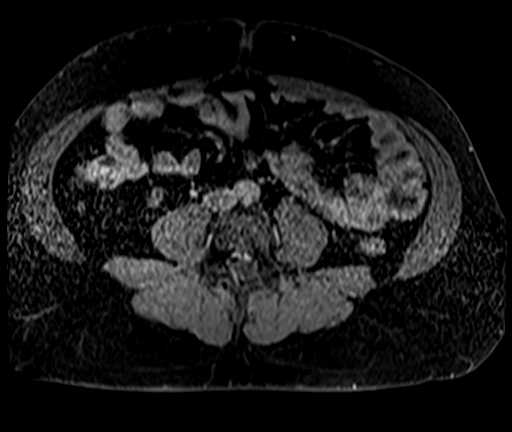

[28 of 48 positions shown; findings below may reference images not displayed]

FINDINGS: Lower chest: No acute abnormality.

Hepatobiliary: Mild diffuse hepatic steatosis. There are innumerable
bilobar well-circumscribed fluid signal lesions of varying size,
none of which demonstrate abnormal postcontrast enhancement on are
most consistent with benign hepatic cysts/biliary hamartomas.

There is a wedge-shaped focus of arterial enhancement in segment [place]
and segment II on images [DATE] and [DATE] respectively, these
equilibrate to background liver on more delayed postcontrast pulse
sequences and are intrinsically isointense to liver on T2 and non
contrasted T1 pulse sequences without reduced diffusivity.

No solid enhancing hepatic lesion.

5 mm gallbladder polyp. No cholelithiasis or evidence of acute
cholecystitis.

No biliary ductal dilation.

Pancreas: Intrinsic T1 signal a pancreatic parenchyma is within
normal limits. No pancreatic ductal dilation. Homogeneous
postcontrast enhancement of the pancreas. No cystic or solid
hyperenhancing pancreatic lesion identified.

Spleen: Similar mild splenic prominence measuring 14 cm in maximum
craniocaudal dimension.

Adrenals/Urinary Tract: Bilateral adrenal glands appear normal. No
hydronephrosis. Small bilateral renal cysts. No solid enhancing
renal mass.

Stomach/Bowel: Visualized portions within the abdomen are
unremarkable.

Vascular/Lymphatic: Normal caliber aorta. The portal, splenic and
superior mesenteric veins are patent. No pathologically enlarged
abdominal lymph nodes.

Other:  No abdominal free fluid.

Musculoskeletal: No suspicious osseous lesion identified.
IMPRESSION: 1. Innumerable benign bilobar hepatic cysts/biliary hamartomas.
2. Peripheral wedge-shaped areas of hyperenhancement in the left
lobe of the liver which equilibrate to background liver on delayed
pulse sequences without abnormal intrinsic T1 or T2 signal, most
consistent with benign intrahepatic shunts or transient hepatic
intensity differences.
3. No solid enhancing hepatic lesion identified.
4. Mild diffuse hepatic steatosis.
5. Similar mild splenic prominence.
6. Small bilateral renal cysts.

## 2022-08-14 ENCOUNTER — Encounter: Payer: Self-pay | Admitting: Family

## 2022-08-14 ENCOUNTER — Ambulatory Visit (INDEPENDENT_AMBULATORY_CARE_PROVIDER_SITE_OTHER): Payer: BC Managed Care – PPO | Admitting: Family

## 2022-08-14 VITALS — BP 118/68 | HR 80 | Temp 98.2°F | Resp 19 | Ht 70.0 in | Wt 247.0 lb

## 2022-08-14 DIAGNOSIS — Z0000 Encounter for general adult medical examination without abnormal findings: Secondary | ICD-10-CM

## 2022-08-14 DIAGNOSIS — E7800 Pure hypercholesterolemia, unspecified: Secondary | ICD-10-CM

## 2022-08-14 DIAGNOSIS — I10 Essential (primary) hypertension: Secondary | ICD-10-CM | POA: Diagnosis not present

## 2022-08-14 LAB — CBC WITH DIFFERENTIAL/PLATELET
Basophils Absolute: 0 10*3/uL (ref 0.0–0.1)
Basophils Relative: 0.4 % (ref 0.0–3.0)
Eosinophils Absolute: 0.1 10*3/uL (ref 0.0–0.7)
Eosinophils Relative: 1.9 % (ref 0.0–5.0)
HCT: 46.8 % (ref 39.0–52.0)
Hemoglobin: 15.8 g/dL (ref 13.0–17.0)
Lymphocytes Relative: 29.3 % (ref 12.0–46.0)
Lymphs Abs: 1.8 10*3/uL (ref 0.7–4.0)
MCHC: 33.8 g/dL (ref 30.0–36.0)
MCV: 86.7 fl (ref 78.0–100.0)
Monocytes Absolute: 0.5 10*3/uL (ref 0.1–1.0)
Monocytes Relative: 8.7 % (ref 3.0–12.0)
Neutro Abs: 3.8 10*3/uL (ref 1.4–7.7)
Neutrophils Relative %: 59.7 % (ref 43.0–77.0)
Platelets: 212 10*3/uL (ref 150.0–400.0)
RBC: 5.39 Mil/uL (ref 4.22–5.81)
RDW: 13.2 % (ref 11.5–15.5)
WBC: 6.3 10*3/uL (ref 4.0–10.5)

## 2022-08-14 LAB — LIPID PANEL
Cholesterol: 179 mg/dL (ref 0–200)
HDL: 50.7 mg/dL (ref >39.00)
LDL Cholesterol: 108 mg/dL — ABNORMAL HIGH (ref 0–99)
NonHDL: 128.52
Total CHOL/HDL Ratio: 4
Triglycerides: 101 mg/dL (ref 0.0–149.0)
VLDL: 20.2 mg/dL (ref 0.0–40.0)

## 2022-08-14 LAB — COMPREHENSIVE METABOLIC PANEL WITH GFR
ALT: 29 U/L (ref 0–53)
AST: 16 U/L (ref 0–37)
Albumin: 4.7 g/dL (ref 3.5–5.2)
Alkaline Phosphatase: 42 U/L (ref 39–117)
BUN: 12 mg/dL (ref 6–23)
CO2: 30 meq/L (ref 19–32)
Calcium: 10.4 mg/dL (ref 8.4–10.5)
Chloride: 104 meq/L (ref 96–112)
Creatinine, Ser: 0.99 mg/dL (ref 0.40–1.50)
GFR: 93.37 mL/min
Glucose, Bld: 96 mg/dL (ref 70–99)
Potassium: 4.5 meq/L (ref 3.5–5.1)
Sodium: 142 meq/L (ref 135–145)
Total Bilirubin: 0.8 mg/dL (ref 0.2–1.2)
Total Protein: 7.1 g/dL (ref 6.0–8.3)

## 2022-08-14 MED ORDER — AMOXICILLIN-POT CLAVULANATE 875-125 MG PO TABS: 1.0000 | ORAL_TABLET | Freq: Two times a day (BID) | ORAL | 0 refills | Status: AC

## 2022-08-14 NOTE — Progress Notes (Signed)
Devin Moreno is a 42 y.o. 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/2020   Acute bronchitis due to COVID-19 virus 07/19/2020   Cough 07/18/2020   GAD (generalized anxiety disorder) 01/28/2020   Vitamin D deficiency disease 01/26/2020   Routine general medical examination at a health care facility 01/25/2020   Essential hypertension 01/25/2020   Migraine without aura and without status migrainosus, not intractable 01/25/2020   Routine adult health maintenance 10/11/2016   Hemorrhoid 10/11/2016    Current Outpatient Medications  Medication Sig Dispense Refill   amoxicillin-clavulanate (AUGMENTIN) 875-125 MG tablet Take 1 tablet by mouth 2 (two) times daily for 10 days. 20 tablet 0   FOCALIN XR 20 MG 24 hr capsule      tadalafil (CIALIS) 5 MG tablet Take 1 tablet (5 mg total) by mouth daily. 30 tablet 5   tamsulosin (FLOMAX) 0.4 MG CAPS capsule Take 0.4 mg by mouth at bedtime. Using prn     Ubrogepant (UBRELVY) 100 MG TABS Take 1 tablet by mouth daily as needed. 30 tablet 1   valsartan (DIOVAN) 80 MG tablet Take 1 tablet (80 mg total) by mouth daily. 90 tablet 3   No current facility-administered medications for this visit.    Allergies: Patient has no known allergies.  Past Medical History:  Diagnosis Date   Hypertension     Past Surgical History:  Procedure Laterality Date   WISDOM TOOTH EXTRACTION      Family History  Problem Relation Age of Onset   Healthy Mother    Hypertension Father    Heart disease Father    Tuberculosis Maternal Grandmother    Prostate cancer Maternal Grandfather     Social History   Tobacco Use   Smoking status: Former    Packs/day: 1.00    Years: 16.00    Total pack years: 16.00    Types: Cigarettes   Smokeless tobacco: Never  Substance Use Topics   Alcohol use: Yes    Alcohol/week: 7.0 standard drinks of alcohol    Types: 7 Cans of beer  per week    Subjective:   Presents for yearly CPE; notes that work is very stressful at the moment; also concerned that has a sinus infection x 10 days; continuing to work with specialist for management of ADD;  Review of Systems  Constitutional: Negative.   HENT: Negative.    Eyes: Negative.   Respiratory: Negative.    Cardiovascular: Negative.   Gastrointestinal: Negative.   Genitourinary: Negative.   Musculoskeletal: Negative.   Skin: Negative.   Neurological: Negative.   Endo/Heme/Allergies: Negative.   Psychiatric/Behavioral: Negative.       Objective:  Vitals:   08/14/22 1000  BP: 118/68  Pulse: 80  Resp: 19  Temp: 98.2 F (36.8 C)  TempSrc: Oral  SpO2: 98%  Weight: 247 lb (112 kg)  Height: 5' 10"$  (1.778 m)    General: Well developed, well nourished, in no acute distress  Skin : Warm and dry.  Head: Normocephalic and atraumatic  Eyes: Sclera and conjunctiva clear; pupils round and reactive to light; extraocular movements intact  Ears: External normal; canals clear; tympanic membranes normal  Oropharynx: Pink, supple. No suspicious lesions  Neck: Supple without thyromegaly, adenopathy  Lungs: Respirations unlabored; clear to auscultation bilaterally without wheeze, rales, rhonchi  CVS exam: normal rate and regular rhythm.  Abdomen: Soft; nontender; nondistended; normoactive bowel sounds; no masses  or hepatosplenomegaly  Musculoskeletal: No deformities; no active joint inflammation  Extremities: No edema, cyanosis, clubbing  Vessels: Symmetric bilaterally  Neurologic: Alert and oriented; speech intact; face symmetrical; moves all extremities well; CNII-XII intact without focal deficit   Assessment:  1. PE (physical exam), annual   2. Elevated LDL cholesterol level   3. Essential hypertension     Plan:  Age appropriate preventive healthcare needs addressed; encouraged regular eye doctor and dental exams; encouraged regular exercise; will update labs and  refills as needed today; follow-up to be determined; Rx for Augmentin 875 mg bid x 10 days to treat suspected sinus infection;   No follow-ups on file.  Orders Placed This Encounter  Procedures   Lipid panel   Comp Met (CMET)   CBC with Differential/Platelet    Requested Prescriptions   Signed Prescriptions Disp Refills   amoxicillin-clavulanate (AUGMENTIN) 875-125 MG tablet 20 tablet 0    Sig: Take 1 tablet by mouth 2 (two) times daily for 10 days.

## 2022-09-06 ENCOUNTER — Other Ambulatory Visit: Payer: Self-pay | Admitting: Family

## 2022-09-10 ENCOUNTER — Encounter: Payer: Self-pay | Admitting: Family

## 2022-11-14 ENCOUNTER — Encounter: Payer: Self-pay | Admitting: Family

## 2022-11-14 ENCOUNTER — Ambulatory Visit (INDEPENDENT_AMBULATORY_CARE_PROVIDER_SITE_OTHER): Payer: BC Managed Care – PPO

## 2022-11-14 ENCOUNTER — Encounter: Payer: Self-pay | Admitting: Emergency Medicine

## 2022-11-14 ENCOUNTER — Ambulatory Visit
Admission: EM | Admit: 2022-11-14 | Discharge: 2022-11-14 | Disposition: A | Discharge status: Home / Self Care | Payer: BC Managed Care – PPO | Attending: Internal Medicine | Admitting: Internal Medicine

## 2022-11-14 DIAGNOSIS — R109 Unspecified abdominal pain: Secondary | ICD-10-CM | POA: Diagnosis not present

## 2022-11-14 DIAGNOSIS — N200 Calculus of kidney: Secondary | ICD-10-CM

## 2022-11-14 DIAGNOSIS — R300 Dysuria: Secondary | ICD-10-CM

## 2022-11-14 LAB — POCT URINALYSIS DIP (MANUAL ENTRY)
Bilirubin, UA: NEGATIVE
Glucose, UA: NEGATIVE mg/dL
Ketones, POC UA: NEGATIVE mg/dL
Leukocytes, UA: NEGATIVE
Nitrite, UA: NEGATIVE
Spec Grav, UA: 1.02 (ref 1.010–1.025)
Urobilinogen, UA: 0.2 E.U./dL
pH, UA: 7 (ref 5.0–8.0)

## 2022-11-14 MED ORDER — HYDROCODONE-ACETAMINOPHEN 5-325 MG PO TABS: 1.0000 | ORAL_TABLET | Freq: Four times a day (QID) | ORAL | 0 refills | Status: DC | PRN

## 2022-11-14 MED ORDER — TAMSULOSIN HCL 0.4 MG PO CAPS: 0.4000 mg | ORAL_CAPSULE | Freq: Every day | ORAL | 0 refills | Status: DC

## 2022-11-14 NOTE — Discharge Instructions (Signed)
It appears that you have 2 kidney stones.  Please strain your urine and follow-up with urology.  I have prescribed tamsulosin for you to take daily to help remove the stones.  Norco has also been prescribed to take as needed for pain.  Please be advised that it can make you drowsy so use this sparingly and do not drink alcohol or drive with taking it.  Also contains Tylenol  so do not take any additional Tylenol while taking it.

## 2022-11-14 NOTE — ED Triage Notes (Addendum)
Over the last week reports painful urination. Hx of BPH with treatment. New flank pain starting this morning on right side. Hx of kidney stones in the past and believes this may be the same thing. Reports noticing some blood or "chunks" in his urine today. Denies fever. Has not taken any meds to help symptoms today.

## 2022-11-14 NOTE — ED Provider Notes (Signed)
[facility]    CSN: [id number] Arrival date & time: 11/14/22  1446      History   Chief Complaint Chief Complaint  Patient presents with   Dysuria    HPI Devin Moreno is a 42 y.o. male.   Patient presents with approximately 1 week history of urinary burning.  Patient reports that he developed right-sided flank/kidney pain about 2 days ago.  Patient states he noticed some hematuria today as well.  Denies urinary frequency, penile discharge, abdominal pain, back pain, nausea, vomiting but does report some chills recently.  Reports that he has a history of kidney stones approximately 10 years ago and this feels very similar.  Reports that previous kidney stone passed on its own.  Denies exposure or concern for STD.   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   Acute bronchitis due to COVID-19 virus 07/19/2020   Cough 07/18/2020   GAD (generalized anxiety disorder) 01/28/2020   Vitamin D deficiency disease 01/26/2020   Routine general medical examination at a health care facility 01/25/2020   Essential hypertension 01/25/2020   Migraine without aura and without status migrainosus, not intractable 01/25/2020   Routine adult health maintenance 10/11/2016   Hemorrhoid 10/11/2016    Past Surgical History:  Procedure Laterality Date   WISDOM TOOTH EXTRACTION         Home Medications    Prior to Admission medications   Medication Sig Start Date End Date Taking? Authorizing Provider  escitalopram (LEXAPRO) 5 MG tablet Take 5 mg by mouth daily. 10/30/22  Yes [provider]  FOCALIN XR 20 MG 24 hr capsule  08/06/22  Yes [provider]  HYDROcodone-acetaminophen (NORCO/VICODIN) 5-325 MG tablet Take 1 tablet by mouth every 6 (six) hours as needed for severe pain. 11/14/22  Yes Gustavus Bryant, FNP  tadalafil (CIALIS) 5 MG tablet Take  1 tablet by mouth once daily 09/06/22  Yes Olive Bass, FNP  tamsulosin (FLOMAX) 0.4 MG CAPS capsule Take 0.4 mg by mouth at bedtime. Using prn 04/04/21  Yes [provider]  tamsulosin (FLOMAX) 0.4 MG CAPS capsule Take 1 capsule (0.4 mg total) by mouth daily. 11/14/22  Yes Cedrik Heindl, [place] E, FNP  Ubrogepant (UBRELVY) 100 MG TABS Take 1 tablet by mouth daily as needed. 08/10/21  Yes Olive Bass, FNP  valsartan (DIOVAN) 80 MG tablet Take 1 tablet (80 mg total) by mouth daily. 01/26/22  Yes Olive Bass, FNP    Family History Family History  Problem Relation Age of Onset   Healthy Mother    Hypertension Father    Heart disease Father    Tuberculosis Maternal Grandmother    Prostate cancer Maternal Grandfather     Social History Social History   Tobacco Use   Smoking status: Former    Packs/day: 1.00    Years: 16.00    Additional pack years: 0.00    Total pack years: 16.00    Types: Cigarettes   Smokeless tobacco: Never  Substance Use Topics   Alcohol use: Yes    Alcohol/week: 7.0 standard drinks of alcohol    Types: 7 Cans of beer per week   Drug use: No     Allergies   Patient has no known allergies.   Review of Systems Review of Systems Per HPI  Physical Exam Triage Vital Signs ED Triage  Vitals  Enc Vitals Group     BP 11/14/22 1543 134/89     Pulse Rate 11/14/22 1543 91     Resp 11/14/22 1543 20     Temp 11/14/22 1543 98 F (36.7 C)     Temp Source 11/14/22 1543 Oral     SpO2 11/14/22 1543 92 %     Weight --      Height --      Head Circumference --      Peak Flow --      Pain Score 11/14/22 1553 7     Pain Loc --      Pain Edu? --      Excl. in GC? --    No data found.  Updated Vital Signs BP 134/89 (BP Location: Left Arm)   Pulse 91   Temp 98 F (36.7 C) (Oral)   Resp 20   SpO2 95%   Visual Acuity Right Eye Distance:   Left Eye Distance:   Bilateral Distance:    Right Eye Near:   Left Eye Near:     Bilateral Near:     Physical Exam Constitutional:      General: He is not in acute distress.    Appearance: Normal appearance. He is not toxic-appearing or diaphoretic.  HENT:     Head: Normocephalic and atraumatic.  Eyes:     Extraocular Movements: Extraocular movements intact.     Conjunctiva/sclera: Conjunctivae normal.  Pulmonary:     Effort: Pulmonary effort is normal.  Musculoskeletal:     Comments: Patient does have mild tenderness to palpation to right flank.  Neurological:     General: No focal deficit present.     Mental Status: He is alert and oriented to person, place, and time. Mental status is at baseline.  Psychiatric:        Mood and Affect: Mood normal.        Behavior: Behavior normal.        Thought Content: Thought content normal.        Judgment: Judgment normal.      UC Treatments / Results  Labs (all labs ordered are listed, but only abnormal results are displayed) Labs Reviewed  POCT URINALYSIS DIP (MANUAL ENTRY) - Abnormal; Notable for the following components:      Result Value   Clarity, UA cloudy (*)    Blood, UA moderate (*)    Protein Ur, POC trace (*)    All other components within normal limits    EKG   Radiology DG Abdomen 1 View  Result Date: 11/14/2022 CLINICAL DATA:  Dysuria, BPH, right flank pain EXAM: ABDOMEN - 1 VIEW COMPARISON:  09/06/2021 FINDINGS: Supine frontal view of the abdomen and pelvis was obtained, excluding the hemidiaphragms and pubic symphysis by collimation. No bowel obstruction or ileus. There is a rounded 5 mm calcification to the right of the L1 transverse process, which could reflect a calculus in the right renal pelvis or proximal right ureter. Indeterminate 5 mm calcification to the right of the sacrococcygeal junction, which could be vascular or a distal right ureteral calculus. No bowel obstruction or ileus. No acute bony abnormalities. IMPRESSION: 1. Possible urinary tract calculi in the region of the right  renal pelvis and right UVJ. If further evaluation is desired, unenhanced CT abdomen and pelvis could be performed. Electronically Signed   By: Sharlet Salina M.D.   On: 11/14/2022 16:44    Procedures Procedures (including critical care time)  Medications  Ordered in UC Medications - No data to display  Initial Impression / Assessment and Plan / UC Course  I have reviewed the triage vital signs and the nursing notes.  Pertinent labs & imaging results that were available during my care of the patient were reviewed by me and considered in my medical decision making (see chart for details).     KUB is showing possible stones in the urinary tract.  UA was unremarkable.  Patient's renal stones are most likely causing patient symptoms and pain.  Therefore, patient was advised to follow-up with his established urologist for further evaluation and management.  Patient reports that he takes tamsulosin only as needed for BPH and he only has a few pills left so this was refilled for patient to take daily to help with passage of stone.  Patient also requesting pain medication so this was prescribed.  Advised patient that Norco can make him drowsy, to use sparingly, and do not drive or drink alcohol with taking it.  PDMP reviewed.  Advised to not take any additional Tylenol while taking Norco as well.  Discussed strict ER precautions.  Patient verbalized understanding and was agreeable with plan. Final Clinical Impressions(s) / UC Diagnoses   Final diagnoses:  Flank pain  Renal stone  Dysuria     Discharge Instructions      It appears that you have 2 kidney stones.  Please strain your urine and follow-up with urology.  I have prescribed tamsulosin for you to take daily to help remove the stones.  Norco has also been prescribed to take as needed for pain.  Please be advised that it can make you drowsy so use this sparingly and do not drink alcohol or drive with taking it.  Also contains Tylenol  so do  not take any additional Tylenol while taking it.     ED Prescriptions     Medication Sig Dispense Auth. Provider   tamsulosin (FLOMAX) 0.4 MG CAPS capsule Take 1 capsule (0.4 mg total) by mouth daily. 30 capsule [place], [place]ston E, [place]   HYDROcodone-acetaminophen (NORCO/VICODIN) 5-325 MG tablet Take 1 tablet by mouth every 6 (six) hours as needed for severe pain. 10 tablet [place], Acie Fredrickson, [place]      I have reviewed the PDMP during this encounter.   Gustavus Bryant, [place] 11/14/22 [phone]

## 2022-11-15 ENCOUNTER — Ambulatory Visit: Payer: BC Managed Care – PPO | Admitting: Family

## 2022-11-15 NOTE — Telephone Encounter (Signed)
Please see below and advise if the pt should keep the apt.

## 2022-12-19 ENCOUNTER — Encounter: Payer: Self-pay | Admitting: Family

## 2023-02-05 ENCOUNTER — Ambulatory Visit: Payer: BC Managed Care – PPO | Admitting: Family

## 2023-02-08 ENCOUNTER — Ambulatory Visit: Payer: BC Managed Care – PPO | Admitting: Family

## 2023-02-08 VITALS — BP 144/92 | HR 105 | Resp 19 | Ht 70.0 in | Wt 249.4 lb

## 2023-02-08 DIAGNOSIS — R109 Unspecified abdominal pain: Secondary | ICD-10-CM

## 2023-02-08 DIAGNOSIS — I10 Essential (primary) hypertension: Secondary | ICD-10-CM

## 2023-02-08 DIAGNOSIS — Z77011 Contact with and (suspected) exposure to lead: Secondary | ICD-10-CM

## 2023-02-08 DIAGNOSIS — R7309 Other abnormal glucose: Secondary | ICD-10-CM | POA: Diagnosis not present

## 2023-02-08 DIAGNOSIS — R5383 Other fatigue: Secondary | ICD-10-CM | POA: Diagnosis not present

## 2023-02-08 NOTE — Patient Instructions (Signed)
Please check your blood pressure for the next 2 weeks as we discussed and send over results for review; we may need to adjust your medication;

## 2023-02-08 NOTE — Progress Notes (Signed)
Devin Moreno is a 42 y.o. 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/2020   Acute bronchitis due to COVID-19 virus 07/19/2020   Cough 07/18/2020   GAD (generalized anxiety disorder) 01/28/2020   Vitamin D deficiency disease 01/26/2020   Routine general medical examination at a health care facility 01/25/2020   Essential hypertension 01/25/2020   Migraine without aura and without status migrainosus, not intractable 01/25/2020   Routine adult health maintenance 10/11/2016   Hemorrhoid 10/11/2016    Current Outpatient Medications  Medication Sig Dispense Refill   escitalopram (LEXAPRO) 10 MG tablet Take 10 mg by mouth daily.     FOCALIN XR 20 MG 24 hr capsule      tadalafil (CIALIS) 5 MG tablet Take 1 tablet by mouth once daily 30 tablet 6   tamsulosin (FLOMAX) 0.4 MG CAPS capsule Take 1 capsule (0.4 mg total) by mouth daily. 30 capsule 0   Ubrogepant (UBRELVY) 100 MG TABS Take 1 tablet by mouth daily as needed. 30 tablet 1   valsartan (DIOVAN) 80 MG tablet Take 1 tablet (80 mg total) by mouth daily. 90 tablet 3   No current facility-administered medications for this visit.    Allergies: Patient has no known allergies.  Past Medical History:  Diagnosis Date   Hypertension     Past Surgical History:  Procedure Laterality Date   WISDOM TOOTH EXTRACTION      Family History  Problem Relation Age of Onset   Healthy Mother    Hypertension Father    Heart disease Father    Tuberculosis Maternal Grandmother    Prostate cancer Maternal Grandfather     Social History   Tobacco Use   Smoking status: Former    Current packs/day: 1.00    Average packs/day: 1 pack/day for 16.0 years (16.0 ttl pk-yrs)    Types: Cigarettes   Smokeless tobacco: Never  Substance Use Topics   Alcohol use: Yes    Alcohol/week: 7.0 standard drinks of alcohol    Types: 7 Cans of beer per week     Subjective:   Requesting to have labs updated- 1) Concern for lead exposure; has recently had water tested and was told that "level was higher" than EPA accepted levels;  2) Requesting testosterone level to be updated; concerned about worsening hair loss/ lack of energy;  3) Would like to also have gluten testing;     Objective:  Vitals:   02/08/23 1341 02/08/23 1547  BP: (!) 144/92 (!) 144/92  Pulse: (!) 105   Resp: 19   SpO2: 97%   Weight: 249 lb 6.4 oz (113.1 kg)   Height: 5\' 10"  (1.778 m)     General: Well developed, well nourished, in no acute distress  Skin : Warm and dry.  Head: Normocephalic and atraumatic  Eyes: Sclera and conjunctiva clear; pupils round and reactive to light; extraocular movements intact  Ears: External normal; canals clear; tympanic membranes normal  Oropharynx: Pink, supple. No suspicious lesions  Neck: Supple without thyromegaly, adenopathy  Lungs: Respirations unlabored; clear to auscultation bilaterally without wheeze, rales, rhonchi  CVS exam: normal rate and regular rhythm.  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 Other fatigue   3. Abdominal pain, unspecified abdominal location   4. Elevated glucose   5. Essential hypertension     Plan:  Will update labs for patient as requested; He is encouraged to start checking his blood pressure regularly and send readings over in about 2 weeks- may need to consider increased dosage of Diovan;   No follow-ups on file.  Orders Placed This Encounter  Procedures   Lead, blood (adult age 71 yrs or greater)    Order Specific Question:    [place] of residence?    Answer:   [place] [[id number]]   Testosterone Total,Free,Bio, Males-([facility])   Celiac Disease Panel   CBC with Differential/Platelet   Comp Met (CMET)   TSH   Hemoglobin A1c    Requested Prescriptions    No prescriptions requested or ordered in this encounter

## 2023-02-12 LAB — CBC WITH DIFFERENTIAL/PLATELET
Absolute Monocytes: 499 {cells}/uL (ref 200–950)
Basophils Absolute: 13 {cells}/uL (ref 0–200)
Basophils Relative: 0.2 %
Eosinophils Absolute: 77 cells/uL (ref 15–500)
Eosinophils Relative: 1.2 %
HCT: 45.5 % (ref 38.5–50.0)
Hemoglobin: 15.4 g/dL (ref 13.2–17.1)
Lymphs Abs: 1600 {cells}/uL (ref 850–3900)
MCH: 29 pg (ref 27.0–33.0)
MCHC: 33.8 g/dL (ref 32.0–36.0)
MCV: 85.7 fL (ref 80.0–100.0)
MPV: 12.2 fL (ref 7.5–12.5)
Monocytes Relative: 7.8 %
Neutro Abs: 4211 {cells}/uL (ref 1500–7800)
Neutrophils Relative %: 65.8 %
Platelets: 213 10*3/uL (ref 140–400)
RBC: 5.31 10*6/uL (ref 4.20–5.80)
RDW: 13.1 % (ref 11.0–15.0)
Total Lymphocyte: 25 %
WBC: 6.4 10*3/uL (ref 3.8–10.8)

## 2023-02-12 LAB — COMPREHENSIVE METABOLIC PANEL
AG Ratio: 2.1 (calc) (ref 1.0–2.5)
ALT: 24 U/L (ref 9–46)
AST: 18 U/L (ref 10–40)
Albumin: 4.7 g/dL (ref 3.6–5.1)
Alkaline phosphatase (APISO): 44 U/L (ref 36–130)
BUN: 14 mg/dL (ref 7–25)
CO2: 25 mmol/L (ref 20–32)
Calcium: 10 mg/dL (ref 8.6–10.3)
Chloride: 103 mmol/L (ref 98–110)
Creat: 1.08 mg/dL (ref 0.60–1.29)
Globulin: 2.2 g/dL (ref 1.9–3.7)
Glucose, Bld: 93 mg/dL (ref 65–99)
Potassium: 4.1 mmol/L (ref 3.5–5.3)
Sodium: 140 mmol/L (ref 135–146)
Total Bilirubin: 1.3 mg/dL — ABNORMAL HIGH (ref 0.2–1.2)
Total Protein: 6.9 g/dL (ref 6.1–8.1)

## 2023-02-12 LAB — HEMOGLOBIN A1C
Hgb A1c MFr Bld: 5.5 %{Hb} (ref <5.7)
Mean Plasma Glucose: 111 mg/dL
eAG (mmol/L): 6.2 mmol/L

## 2023-02-12 LAB — LEAD, BLOOD (ADULT >= 16 YRS): Lead: <1 ug/dL (ref <3.5)

## 2023-02-12 LAB — EXTRA SPECIMEN

## 2023-02-12 LAB — CELIAC DISEASE PANEL
(tTG) Ab, IgA: <1 U/mL
(tTG) Ab, IgG: <1 U/mL
Gliadin IgA: <1 U/mL
Gliadin IgG: <1 U/mL
Immunoglobulin A: 176 mg/dL (ref 47–310)

## 2023-02-12 LAB — TSH: TSH: 0.61 m[IU]/L (ref 0.40–4.50)

## 2023-02-12 LAB — TESTOSTERONE TOTAL,FREE,BIO, MALES
Albumin: 4.7 g/dL (ref 3.6–5.1)
Sex Hormone Binding: 25 nmol/L (ref 10–50)
Testosterone: 228 ng/dL — ABNORMAL LOW (ref 250–827)

## 2023-03-27 ENCOUNTER — Encounter: Payer: Self-pay | Admitting: Family

## 2023-04-12 ENCOUNTER — Other Ambulatory Visit: Payer: Self-pay | Admitting: Family

## 2023-04-12 MED ORDER — VALSARTAN 160 MG PO TABS: 160.0000 mg | ORAL_TABLET | Freq: Every day | ORAL | 0 refills | Status: DC

## 2023-04-21 ENCOUNTER — Other Ambulatory Visit: Payer: Self-pay | Admitting: Family

## 2023-05-26 ENCOUNTER — Other Ambulatory Visit: Payer: Self-pay | Admitting: Family

## 2023-07-14 ENCOUNTER — Ambulatory Visit
Admission: EM | Admit: 2023-07-14 | Discharge: 2023-07-14 | Disposition: A | Discharge status: Home / Self Care | Payer: 59

## 2023-07-14 ENCOUNTER — Ambulatory Visit (INDEPENDENT_AMBULATORY_CARE_PROVIDER_SITE_OTHER): Payer: 59

## 2023-07-14 DIAGNOSIS — R051 Acute cough: Secondary | ICD-10-CM

## 2023-07-14 DIAGNOSIS — J189 Pneumonia, unspecified organism: Secondary | ICD-10-CM | POA: Diagnosis not present

## 2023-07-14 MED ORDER — AMOXICILLIN-POT CLAVULANATE 875-125 MG PO TABS: 1.0000 | ORAL_TABLET | Freq: Two times a day (BID) | ORAL | 0 refills | Status: DC

## 2023-07-14 MED ORDER — AZITHROMYCIN 250 MG PO TABS: 250.0000 mg | ORAL_TABLET | Freq: Every day | ORAL | 0 refills | Status: DC

## 2023-07-14 NOTE — ED Triage Notes (Addendum)
"  This cough started Tuesday night, both my kids have walking Pna", Maybe a fever in the beginning now with just persistent cough". Some "wheezing" at times, no sob. No runny nose.

## 2023-07-17 ENCOUNTER — Other Ambulatory Visit: Payer: Self-pay | Admitting: Family

## 2023-07-17 ENCOUNTER — Encounter: Payer: Self-pay | Admitting: Family

## 2023-07-18 NOTE — ED Provider Notes (Signed)
[facility]    CSN: [id number] Arrival date & time: 07/14/23  1133      History   Chief Complaint Chief Complaint  Patient presents with   Cough    HPI Devin Moreno is a 43 y.o. male.   Patient here today for evaluation of cough.  He reports that his children have been diagnosed with walking pneumonia.  He denies any nausea or vomiting.  He reports possible fever the beginning of symptoms a few days ago.  The history is provided by the patient.  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 Authorizing Provider  amoxicillin-clavulanate (AUGMENTIN) 875-125 MG tablet Take 1 tablet by mouth every 12 (twelve) hours. 07/14/23  Yes Tomi Bamberger, PA-C  azithromycin (ZITHROMAX) 250 MG tablet Take 1 tablet (250 mg total) by mouth daily. Take first 2 tablets together, then 1 every day until finished. 07/14/23  Yes Tomi Bamberger, PA-C  Dexmethylphenidate HCl (FOCALIN XR) 25 MG CP24 Take 25 mg by mouth daily.   Yes [provider]  escitalopram (LEXAPRO) 10 MG tablet Take 10 mg by mouth daily. 12/03/22  Yes [provider]  tadalafil (CIALIS) 5 MG tablet Take 1 tablet (5 mg total) by mouth daily. 05/27/23  Yes Olive Bass, FNP  Dexmethylphenidate HCl 25 MG CP24 Take 1 capsule by mouth daily. 05/16/23   [provider]  tamsulosin (FLOMAX) 0.4 MG CAPS capsule Take 1 capsule (0.4 mg total) by mouth daily. 11/14/22   [place], Acie Fredrickson, FNP  Ubrogepant (UBRELVY) 100 MG TABS Take 1 tablet by mouth daily as needed. 08/10/21   Olive Bass, FNP  valsartan (DIOVAN) 160 MG tablet Take 1 tablet (160 mg total) by mouth daily. 07/17/23   Olive Bass, FNP    Family History Family History  Problem Relation Age of Onset   Healthy Mother    Hypertension Father    Heart disease Father    Tuberculosis Maternal Grandmother    Prostate cancer Maternal Grandfather     Social History Social History   Tobacco Use   Smoking status: Former    Current packs/day: 1.00    Average packs/day: 1 pack/day for 16.0 years (16.0 ttl pk-yrs)    Types: Cigarettes    Passive exposure: Never   Smokeless tobacco: Never  Vaping Use   Vaping status: Never Used  Substance Use Topics   Alcohol use: Yes    Alcohol/week: 7.0 standard drinks of alcohol    Types: 7 Cans of beer per week    Comment: Occassionally   Drug use: Never  Allergies   Patient has no known allergies.   Review of Systems Review of Systems  Constitutional:  Positive for chills and fever.  HENT:  Positive for congestion. Negative for ear pain.   Eyes:  Negative for discharge and redness.  Respiratory:  Positive for cough. Negative for shortness of breath.   Gastrointestinal:  Negative for abdominal pain, nausea and vomiting.     Physical Exam Triage Vital Signs ED Triage Vitals  Encounter Vitals Group     BP 07/14/23 1326 112/70     Systolic BP Percentile --      Diastolic BP Percentile --      Pulse Rate 07/14/23 1326 91     Resp 07/14/23 1326 20     Temp 07/14/23 1326 98.4 F (36.9 C)     Temp  Source 07/14/23 1326 Oral     SpO2 07/14/23 1326 95 %     Weight 07/14/23 1322 240 lb (108.9 kg)     Height 07/14/23 1322 5\' 10"  (1.778 m)     Head Circumference --      Peak Flow --      Pain Score 07/14/23 1320 0     Pain Loc --      Pain Education --      Exclude from Growth Chart --    No data found.  Updated Vital Signs BP 112/70 (BP Location: Right Arm)   Pulse 91   Temp 98.4 F (36.9 C) (Oral)   Resp 20   Ht 5\' 10"  (1.778 m)   Wt 240 lb (108.9 kg)   SpO2 95%   BMI 34.44 kg/m   Visual Acuity Right Eye Distance:   Left Eye Distance:   Bilateral Distance:    Right Eye Near:   Left Eye Near:    Bilateral Near:     Physical Exam Vitals and nursing note reviewed.  Constitutional:      General: He is not in acute distress.    Appearance: He is well-developed. He is not ill-appearing.  HENT:     Head: Normocephalic and atraumatic.     Nose: Congestion present.     Mouth/Throat:     Mouth: Mucous membranes are moist.     Pharynx: No oropharyngeal exudate or posterior oropharyngeal erythema.     Tonsils: 0 on the right. 0 on the left.  Eyes:     Conjunctiva/sclera: Conjunctivae normal.  Cardiovascular:     Rate and Rhythm: Normal rate and regular rhythm.     Heart sounds: Normal heart sounds. No murmur heard. Pulmonary:     Effort: Pulmonary effort is normal. No respiratory distress.     Breath sounds: Normal breath sounds. No wheezing, rhonchi or rales.  Skin:    General: Skin is warm and dry.  Neurological:     Mental Status: He is alert.  Psychiatric:        Mood and Affect: Mood normal.        Behavior: Behavior normal.      UC Treatments / Results  Labs (all labs ordered are listed, but only abnormal results are displayed) Labs Reviewed - No data to display  EKG   Radiology No results found.  Procedures Procedures (including critical care time)  Medications Ordered in UC Medications - No data to display  Initial Impression /  Assessment and Plan / UC Course  I have reviewed the triage vital signs and the nursing notes.  Pertinent labs & imaging results that were available  during my care of the patient were reviewed by me and considered in my medical decision making (see chart for details).    Will treat with Augmentin as well as Z-Pak to cover pneumonia noted on x-ray.  Advised follow-up if no gradual improvement with any further concerns.  Final Clinical Impressions(s) / UC Diagnoses   Final diagnoses:  Acute cough  Pneumonia of right middle lobe due to infectious organism   Discharge Instructions   None    ED Prescriptions     Medication Sig Dispense Auth. Provider   amoxicillin-clavulanate (AUGMENTIN) 875-125 MG tablet Take 1 tablet by mouth every 12 (twelve) hours. 14 tablet Erma Pinto F, PA-C   azithromycin (ZITHROMAX) 250 MG tablet Take 1 tablet (250 mg total) by mouth daily. Take first 2 tablets together, then 1 every day until finished. 6 tablet Tomi Bamberger, PA-C      PDMP not reviewed this encounter.   Tomi Bamberger, PA-C 07/18/23 [phone]

## 2023-07-19 ENCOUNTER — Other Ambulatory Visit (HOSPITAL_BASED_OUTPATIENT_CLINIC_OR_DEPARTMENT_OTHER): Payer: Self-pay

## 2023-07-19 ENCOUNTER — Ambulatory Visit (INDEPENDENT_AMBULATORY_CARE_PROVIDER_SITE_OTHER): Payer: 59 | Admitting: Family

## 2023-07-19 ENCOUNTER — Encounter: Payer: Self-pay | Admitting: Family

## 2023-07-19 VITALS — BP 134/88 | HR 96 | Temp 98.7°F | Ht 70.0 in | Wt 246.8 lb

## 2023-07-19 DIAGNOSIS — J9801 Acute bronchospasm: Secondary | ICD-10-CM | POA: Diagnosis not present

## 2023-07-19 DIAGNOSIS — J189 Pneumonia, unspecified organism: Secondary | ICD-10-CM

## 2023-07-19 MED ORDER — PREDNISONE 20 MG PO TABS
ORAL_TABLET | ORAL | 0 refills | Status: DC
  Filled 2023-07-19: qty 9, 7d supply, fill #0

## 2023-07-19 MED ORDER — AMOXICILLIN-POT CLAVULANATE 875-125 MG PO TABS
1.0000 | ORAL_TABLET | Freq: Two times a day (BID) | ORAL | 0 refills | Status: AC
  Filled 2023-07-19: qty 6, 3d supply, fill #0

## 2023-07-19 NOTE — Progress Notes (Signed)
Devin Moreno is a 43 y.o. 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 14 tablet 0   amoxicillin-clavulanate (AUGMENTIN) 875-125 MG tablet Take 1 tablet by mouth 2 (two) times daily for 3 days. 6 tablet 0   Dexmethylphenidate HCl (FOCALIN XR) 25 MG CP24 Take 25 mg by mouth daily.     Dexmethylphenidate HCl 25 MG CP24 Take 1 capsule by mouth daily.     escitalopram (LEXAPRO) 10 MG tablet Take 10 mg by mouth daily.     predniSONE (DELTASONE) 20 MG tablet Take 2 tablets on day 1 and day 2, then take 1 tablet daily x 5 days 9 tablet 0   tadalafil (CIALIS) 5 MG tablet Take 1 tablet (5 mg total) by mouth daily. 30 tablet 1   tamsulosin (FLOMAX) 0.4 MG CAPS capsule Take 1 capsule (0.4 mg total) by mouth daily. 30 capsule 0   Ubrogepant (UBRELVY) 100 MG TABS Take 1 tablet by mouth daily as needed. 30 tablet 1   valsartan (DIOVAN) 160 MG tablet Take 1 tablet (160 mg total) by mouth daily. 90 tablet 0   azithromycin (ZITHROMAX) 250 MG tablet Take 1 tablet (250 mg total) by mouth daily. Take first 2 tablets together, then 1 every day until finished. 6 tablet 0   No current facility-administered medications for this visit.    Allergies: Patient has no known allergies.  Past Medical History:  Diagnosis Date   Acute bronchitis due to COVID-19 virus 07/19/2020   Cough 07/18/2020    Hypertension     Past Surgical History:  Procedure Laterality Date   WISDOM TOOTH EXTRACTION      Family History  Problem Relation Age of Onset   Healthy Mother    Hypertension Father    Heart disease Father    Tuberculosis Maternal Grandmother    Prostate cancer Maternal Grandfather     Social History   Tobacco Use   Smoking status: Former    Current packs/day: 1.00    Average packs/day: 1 pack/day for 16.0 years (16.0 ttl pk-yrs)    Types: Cigarettes    Passive exposure: Never   Smokeless tobacco: Never  Substance Use Topics   Alcohol use: Yes    Alcohol/week: 7.0 standard drinks of alcohol    Types: 7 Cans of beer per week    Comment: Occassionally    Subjective:   Follow up on pneumonia; diagnosed at [facility] on 07[facility]; currently on combination of Augmentin and Z-pak; slight improvement in symptoms; continuing with coughing fits throughout the day;   Objective:  Vitals:   07/19/23 0911  BP: 134/88  Pulse: 96  Temp: 98.7 F (37.1 C)  TempSrc: Oral  SpO2: 97%  Weight: 246 lb 12.8 oz (111.9 kg)  Height: 5\' 10"  (1.778 m)    General: Well developed, well nourished, in no acute distress  Skin : Warm and dry.  Head: Normocephalic and atraumatic  Eyes: Sclera and conjunctiva  clear; pupils round and reactive to light; extraocular movements intact  Ears: External normal; canals clear; tympanic membranes normal  Oropharynx: Pink, supple. No suspicious lesions  Neck: Supple without thyromegaly, adenopathy  Lungs: Respirations unlabored; clear to auscultation bilaterally without wheeze, rales, rhonchi  CVS exam: normal rate and regular rhythm.  Neurologic: Alert and oriented; speech intact; face symmetrical; moves all extremities well; CNII-XII intact without focal deficit   Assessment:  1. Pneumonia due to infectious organism, unspecified laterality, unspecified part of lung   2. Bronchospasm     Plan:  Physical exam is reassuring; will extend Augmentin for 3 more  days and start 5 day course of prednisone; increase fluids, rest; discussed that can take 4-6 weeks to fully recover from pneumonia;   No follow-ups on file.  No orders of the defined types were placed in this encounter.   Requested Prescriptions   Signed Prescriptions Disp Refills   amoxicillin-clavulanate (AUGMENTIN) 875-125 MG tablet 6 tablet 0    Sig: Take 1 tablet by mouth 2 (two) times daily for 3 days.   predniSONE (DELTASONE) 20 MG tablet 9 tablet 0    Sig: Take 2 tablets on day 1 and day 2, then take 1 tablet daily x 5 days

## 2023-08-05 ENCOUNTER — Other Ambulatory Visit: Payer: Self-pay | Admitting: Family

## 2023-08-07 ENCOUNTER — Encounter: Payer: Self-pay | Admitting: Family

## 2023-08-08 ENCOUNTER — Other Ambulatory Visit: Payer: Self-pay | Admitting: Family

## 2023-08-08 DIAGNOSIS — L989 Disorder of the skin and subcutaneous tissue, unspecified: Secondary | ICD-10-CM

## 2023-08-26 ENCOUNTER — Other Ambulatory Visit: Payer: Self-pay | Admitting: Family

## 2023-08-27 ENCOUNTER — Other Ambulatory Visit (HOSPITAL_BASED_OUTPATIENT_CLINIC_OR_DEPARTMENT_OTHER): Payer: Self-pay

## 2023-08-27 ENCOUNTER — Ambulatory Visit (INDEPENDENT_AMBULATORY_CARE_PROVIDER_SITE_OTHER): Admitting: Family

## 2023-08-27 ENCOUNTER — Encounter: Payer: Self-pay | Admitting: Family

## 2023-08-27 VITALS — BP 142/80 | HR 101 | Temp 98.6°F | Ht 70.0 in | Wt 254.4 lb

## 2023-08-27 DIAGNOSIS — R051 Acute cough: Secondary | ICD-10-CM | POA: Diagnosis not present

## 2023-08-27 DIAGNOSIS — U071 COVID-19: Secondary | ICD-10-CM

## 2023-08-27 LAB — POCT INFLUENZA A/B
Influenza A, POC: NEGATIVE
Influenza B, POC: NEGATIVE

## 2023-08-27 LAB — POC COVID19 BINAXNOW: SARS Coronavirus 2 Ag: POSITIVE — AB

## 2023-08-27 MED ORDER — BENZONATATE 100 MG PO CAPS
200.0000 mg | ORAL_CAPSULE | Freq: Three times a day (TID) | ORAL | 0 refills | Status: DC | PRN
  Filled 2023-08-27: qty 20, 4d supply, fill #0

## 2023-08-27 MED ORDER — PREDNISONE 20 MG PO TABS
ORAL_TABLET | ORAL | 0 refills | Status: DC
  Filled 2023-08-27: qty 9, 7d supply, fill #0

## 2023-08-27 MED ORDER — DOXYCYCLINE HYCLATE 100 MG PO TABS
100.0000 mg | ORAL_TABLET | Freq: Two times a day (BID) | ORAL | 0 refills | Status: DC
  Filled 2023-08-27: qty 14, 7d supply, fill #0

## 2023-08-27 NOTE — Progress Notes (Signed)
 Devin Moreno is a 43 y.o. 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 20 capsule 0   Dexmethylphenidate HCl (FOCALIN XR) 25 MG CP24 Take 25 mg by mouth daily.     Dexmethylphenidate HCl 25 MG CP24 Take 1 capsule by mouth daily.     doxycycline (VIBRA-TABS) 100 MG tablet Take 1 tablet (100 mg total) by mouth 2 (two) times daily. 14 tablet 0   escitalopram (LEXAPRO) 10 MG tablet Take 10 mg by mouth daily.     tadalafil (CIALIS) 5 MG tablet Take 1 tablet by mouth once daily 30 tablet 0   tamsulosin (FLOMAX) 0.4 MG CAPS capsule Take 1 capsule (0.4 mg total) by mouth daily. 30 capsule 0   valsartan (DIOVAN) 160 MG tablet Take 1 tablet (160 mg total) by mouth daily. 90 tablet 0   predniSONE (DELTASONE) 20 MG tablet Take 2 tablets on day 1 and day 2, then take 1 tablet daily x 5 days 9 tablet 0   Ubrogepant (UBRELVY) 100 MG TABS Take 1 tablet by mouth daily as needed. (Patient not taking: Reported on 08/27/2023) 30 tablet 1   No current facility-administered medications for this visit.    Allergies: Patient has no known allergies.  Past Medical History:  Diagnosis Date   Acute bronchitis due to COVID-19 virus 07/19/2020   Cough 07/18/2020   Hypertension     Past Surgical History:  Procedure Laterality Date   WISDOM TOOTH EXTRACTION      Family  History  Problem Relation Age of Onset   Healthy Mother    Hypertension Father    Heart disease Father    Tuberculosis Maternal Grandmother    Prostate cancer Maternal Grandfather     Social History   Tobacco Use   Smoking status: Former    Current packs/day: 1.00    Average packs/day: 1 pack/day for 16.0 years (16.0 ttl pk-yrs)    Types: Cigarettes    Passive exposure: Never   Smokeless tobacco: Never  Substance Use Topics   Alcohol use: Yes    Alcohol/week: 7.0 standard drinks of alcohol    Types: 7 Cans of beer per week    Comment: Occassionally    Subjective:   Started with cough/ cold symptoms last Friday; does actually feel better today and feels symptoms are improving; "just wanted to be sure everything is okay"/ specifically no recurrence of pneumonia;  Did run fever of about 102 on Friday/ Saturday;   Objective:  Vitals:   08/27/23 0857  BP: (!) 142/80  Pulse: (!) 101  Temp: 98.6 F (37 C)  TempSrc: Oral  SpO2: 97%  Weight: 254 lb 6.4 oz (115.4 kg)  Height: 5\' 10"  (1.778 m)    General: Well developed, well nourished, in no acute distress  Skin : Warm and dry.  Head: Normocephalic and atraumatic  Eyes: Sclera and conjunctiva clear; pupils round  and reactive to light; extraocular movements intact  Ears: External normal; canals clear; tympanic membranes normal  Oropharynx: Pink, supple. No suspicious lesions  Neck: Supple without thyromegaly, adenopathy  Lungs: Respirations unlabored; coarse breath sounds in bilateral lower lobes CVS exam: normal rate and regular rhythm.  Neurologic: Alert and oriented; speech intact; face symmetrical; moves all extremities well; CNII-XII intact without focal deficit   Assessment:  1. Acute cough   2. COVID-19     Plan:  Patient is on day 5 of symptoms- will hold Paxlovid and treat for concerning secondary infection; Rx for Doxycycline, Prednisone and Tessalon Perles; increase fluids, rest and follow up worse, no better.    No follow-ups on file.  Orders Placed This Encounter  Procedures   POC COVID-19    Previously tested for COVID-19:   No    Resident in a congregate (group) care setting:   No    Employed in healthcare setting:   No   POCT Influenza A/B    Requested Prescriptions   Signed Prescriptions Disp Refills   doxycycline (VIBRA-TABS) 100 MG tablet 14 tablet 0    Sig: Take 1 tablet (100 mg total) by mouth 2 (two) times daily.   benzonatate (TESSALON PERLES) 100 MG capsule 20 capsule 0    Sig: Take 2 capsules (200 mg total) by mouth 3 (three) times daily as needed for cough.   predniSONE (DELTASONE) 20 MG tablet 9 tablet 0    Sig: Take 2 tablets on day 1 and day 2, then take 1 tablet daily x 5 days

## 2023-09-10 ENCOUNTER — Encounter: Payer: Self-pay | Admitting: Family

## 2023-09-10 NOTE — Telephone Encounter (Signed)
 Form has been printed out and placed in PCP folder for review.

## 2023-10-11 ENCOUNTER — Other Ambulatory Visit: Payer: Self-pay | Admitting: Family

## 2023-10-18 ENCOUNTER — Other Ambulatory Visit: Payer: Self-pay | Admitting: Family

## 2023-11-11 ENCOUNTER — Other Ambulatory Visit: Payer: Self-pay | Admitting: Family

## 2023-11-19 ENCOUNTER — Other Ambulatory Visit: Payer: Self-pay

## 2023-11-19 MED ORDER — TADALAFIL 5 MG PO TABS: 5.0000 mg | ORAL_TABLET | Freq: Every day | ORAL | 3 refills | Status: DC

## 2023-11-21 ENCOUNTER — Telehealth: Payer: Self-pay

## 2023-11-21 ENCOUNTER — Other Ambulatory Visit (HOSPITAL_COMMUNITY): Payer: Self-pay

## 2023-11-21 NOTE — Telephone Encounter (Signed)
 Pharmacy Patient Advocate Encounter   Received notification from Onbase that prior authorization for Tadalafil  5MG  tablets is required/requested.   Insurance verification completed.   The patient is insured through [employer] [facility] .   Per test claim: PA required; PA started via CoverMyMeds. KEY [id number] . Please see clinical question(s) below that I am not finding the answer to in their chart and advise.  *need DX code and documentation in chart

## 2023-11-22 ENCOUNTER — Encounter: Payer: Self-pay | Admitting: Family

## 2023-11-22 NOTE — Telephone Encounter (Signed)
 Pharmacy Patient Advocate Encounter   Received notification from Onbase that prior authorization for Tadalafil  5MG  tablets  is required/requested.   Insurance verification completed.   The patient is insured through [employer] [facility] .   Per test claim: PA required; PA submitted to above mentioned insurance via CoverMyMeds Key/confirmation #/EOC [id number] Status is pending

## 2023-11-25 ENCOUNTER — Other Ambulatory Visit: Payer: Self-pay | Admitting: Family

## 2023-11-25 DIAGNOSIS — N419 Inflammatory disease of prostate, unspecified: Secondary | ICD-10-CM

## 2023-11-25 NOTE — Telephone Encounter (Signed)
 Pharmacy Patient Advocate Encounter  Received notification from [employer] [facility] that Prior Authorization for Tadalafil  5MG  tablets has been DENIED.  Full denial letter will be uploaded to the media tab. See denial reason below.   PA #/Case ID/Reference #:  [id number]

## 2023-11-26 NOTE — Telephone Encounter (Signed)
 Called pt and left a VM informing pt of the message, advised pt to give the office a call back with any further questions or concerns.

## 2024-01-05 NOTE — Progress Notes (Signed)
 Chief Complaint: Pelvic/prostate pain, lower urinary tract symptoms  History of Present Illness:  Devin Moreno is a 43 y.o. male who is seen in consultation from Jason Leita Repine, FNP for evaluation of persistent discomfort in his pelvic/penile area, lower urinary tract symptoms.  These symptoms have been going on for quite some time.  He has been seen by Dr. Selma and Nieves at [facility] urology, as recently as 2024.  At that time he had a stone.  He has had cystoscopy there before which was normal.  He had a vasectomy as well.  With his above symptoms, he was referred for physical therapy which, by report in the notes (at least 4 visits), he did have significant improvement in his pelvic symptoms.  At one time he was on both tamsulosin  and tadalafil .  He is off of tamsulosin , still on tadalafil .  Current IPSS 19/4.  No gross hematuria, but he does have dysuria.  Also has pain after ejaculation.   Past Medical History:  Past Medical History:  Diagnosis Date   Acute bronchitis due to COVID-19 virus 07/19/2020   Cough 07/18/2020   Hypertension     Past Surgical History:  Past Surgical History:  Procedure Laterality Date   WISDOM TOOTH EXTRACTION      Allergies:  No Known Allergies  Family History:  Family History  Problem Relation Age of Onset   Healthy Mother    Hypertension Father    Heart disease Father    Tuberculosis Maternal Grandmother    Prostate cancer Maternal Grandfather     Social History:  Social History   Tobacco Use   Smoking status: Former    Current packs/day: 1.00    Average packs/day: 1 pack/day for 16.0 years (16.0 ttl pk-yrs)    Types: Cigarettes    Passive exposure: Never   Smokeless tobacco: Never  Vaping Use   Vaping status: Never Used  Substance Use Topics   Alcohol use: Yes    Alcohol/week: 7.0 standard drinks of alcohol    Types: 7 Cans of beer per week    Comment: Occassionally   Drug use: Never    Review of symptoms:   Constitutional:  Negative for unexplained weight loss, night sweats, fever, chills ENT:  Negative for nose bleeds, sinus pain, painful swallowing CV:  Negative for chest pain, shortness of breath, exercise intolerance, palpitations, loss of consciousness Resp:  Negative for cough, wheezing, shortness of breath GI:  Negative for nausea, vomiting, diarrhea, bloody stools GU:  Positives noted in HPI; otherwise negative for gross hematuria, dysuria, urinary incontinence Neuro:  Negative for seizures, poor balance, limb weakness, slurred speech Psych:  Negative for lack of energy, depression, anxiety Endocrine:  Negative for polydipsia, polyuria, symptoms of hypoglycemia (dizziness, hunger, sweating) Hematologic:  Negative for anemia, purpura, petechia, prolonged or excessive bleeding, use of anticoagulants  Allergic:  Negative for difficulty breathing or choking as a result of exposure to anything; no shellfish allergy; no allergic response (rash/itch) to materials, foods  Physical exam: There were no vitals taken for this visit. GENERAL APPEARANCE:  Well appearing, well developed, well nourished, NAD HEENT: Atraumatic, Normocephalic. NECK: Normal appearance LUNGS: Normal inspiratory and expiratory excursion HEART: Regular Rate ABDOMEN: Mildly protuberant.  No inguinal hernias. GU: Phallus normal, no lesions. Scrotal skin normal. Testicles/epididymal structures normal. Meatus normal. Normal anal sphincter tone, prostate 20 mL, symmetric, non nodular, non tender.  No specific pelvic floor tenderness. EXTREMITIES: Moves all extremities well.  Without clubbing, cyanosis, or edema. NEUROLOGIC:  Alert and oriented x 3, normal gait, CN II-XII grossly intact.  MENTAL STATUS:  Appropriate. SKIN:  Warm, dry and intact.    Results: [facility] notes reviewed  I have reviewed referring/prior physicians notes  I have reviewed urinalysis--clear today  I have reviewed PSA results--0.31 in  August 2023  I have reviewed prior imaging--CT transcription from May 2024 reviewed.  At that time no evidence of kidney stones.  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  This responded to physical therapy in the past   Plan: 1.  I recommended dietary modification/moderation  2.  I will have him wean off the tadalafil , we started him on alfuzosin   3.  Consider repeat pelvic floor PT in the future.  I also instructed him to do some reading on pelvic floor syndrome  4.  I will have him come back in a couple of months to recheck

## 2024-01-06 ENCOUNTER — Ambulatory Visit: Admitting: Urology

## 2024-01-06 ENCOUNTER — Other Ambulatory Visit (HOSPITAL_BASED_OUTPATIENT_CLINIC_OR_DEPARTMENT_OTHER): Payer: Self-pay

## 2024-01-06 VITALS — BP 113/87 | HR 99 | Ht 70.0 in | Wt 230.0 lb

## 2024-01-06 DIAGNOSIS — N410 Acute prostatitis: Secondary | ICD-10-CM | POA: Diagnosis not present

## 2024-01-06 DIAGNOSIS — R399 Unspecified symptoms and signs involving the genitourinary system: Secondary | ICD-10-CM

## 2024-01-06 LAB — URINALYSIS, ROUTINE W REFLEX MICROSCOPIC
Bilirubin, UA: NEGATIVE
Glucose, UA: NEGATIVE
Ketones, UA: NEGATIVE
Leukocytes,UA: NEGATIVE
Nitrite, UA: NEGATIVE
Protein,UA: NEGATIVE
RBC, UA: NEGATIVE
Specific Gravity, UA: 1.01 (ref 1.005–1.030)
Urobilinogen, Ur: 0.2 mg/dL (ref 0.2–1.0)
pH, UA: 7 (ref 5.0–7.5)

## 2024-01-06 LAB — BLADDER SCAN AMB NON-IMAGING: Scan Result: 49

## 2024-01-06 MED ORDER — ALFUZOSIN HCL ER 10 MG PO TB24
10.0000 mg | ORAL_TABLET | Freq: Every day | ORAL | 11 refills | Status: DC
  Filled 2024-01-06: qty 30, 30d supply, fill #0
  Filled 2024-01-31: qty 30, 30d supply, fill #1

## 2024-01-12 ENCOUNTER — Other Ambulatory Visit: Payer: Self-pay | Admitting: Family

## 2024-01-31 ENCOUNTER — Ambulatory Visit: Payer: Self-pay

## 2024-01-31 ENCOUNTER — Ambulatory Visit
Admission: RE | Admit: 2024-01-31 | Discharge: 2024-01-31 | Disposition: A | Discharge status: Home / Self Care | Source: Ambulatory Visit | Attending: Family Medicine | Admitting: Family Medicine

## 2024-01-31 VITALS — BP 116/78 | HR 67 | Temp 98.1°F | Resp 18 | Wt 229.9 lb

## 2024-01-31 DIAGNOSIS — R1031 Right lower quadrant pain: Secondary | ICD-10-CM | POA: Diagnosis not present

## 2024-01-31 DIAGNOSIS — R195 Other fecal abnormalities: Secondary | ICD-10-CM | POA: Diagnosis not present

## 2024-01-31 MED ORDER — ONDANSETRON 4 MG PO TBDP: 4.0000 mg | ORAL_TABLET | Freq: Three times a day (TID) | ORAL | 0 refills | Status: AC | PRN

## 2024-01-31 MED ORDER — LOPERAMIDE HCL 2 MG PO CAPS: 2.0000 mg | ORAL_CAPSULE | Freq: Two times a day (BID) | ORAL | 0 refills | Status: AC | PRN

## 2024-01-31 NOTE — Discharge Instructions (Addendum)

## 2024-01-31 NOTE — ED Triage Notes (Addendum)
 Kind of sharp pain in lower right abdomen since last night, same spot has not moved.  Was occasionally earlier today but constant since 2pm.  No fever, able to pass gas and stool but stool is loose. From 1-10, maybe 6 or 7. - Entered by patient

## 2024-01-31 NOTE — Telephone Encounter (Signed)
 FYI Only or Action Required?: FYI only for provider.  Patient was last seen in primary care on 08/27/2023 by Jason Leita Repine, FNP.  Called Nurse Triage reporting No chief complaint on file..  Symptoms began today.  Interventions attempted: Rest, hydration, or home remedies.  Symptoms are: gradually worsening.  Triage Disposition: See HCP Within 4 Hours (Or PCP Triage)  Patient/caregiver understands and will follow disposition?:   Reason for Disposition  [1] MILD-MODERATE pain AND [2] constant AND [3] present > 2 hours  Answer Assessment - Initial Assessment Questions This RN contacted patient for triage and he reported that he has already secured an appointment at a [facility] for this evening.   1. LOCATION: Where does it hurt?      Right lower quadrant  2. RADIATION: Does the pain shoot anywhere else? (e.g., chest, back)     Denies  3. ONSET: When did the pain begin? (Minutes, hours or days ago)      Several hours  4. SUDDEN: Gradual or sudden onset?     Sudden  5. PATTERN Does the pain come and go, or is it constant?     Constant  6. SEVERITY: How bad is the pain?  (e.g., Scale 1-10; mild, moderate, or severe)     6-7/10  10. OTHER SYMPTOMS: Do you have any other symptoms? (e.g., back pain, diarrhea, fever, urination pain, vomiting)       Denies, last BM was today and was unremarkable  Protocols used: Abdominal Pain - Male-A-AH Message from [place] T sent at 01/31/2024  4:41 PM EDT  Summary: pain   Pain in lower abdomen on right side, wants to know if he needs to go to urgent care or just weigh it out.

## 2024-01-31 NOTE — ED Provider Notes (Signed)
 [facility]  Note:  This document was prepared using Conservation officer, historic buildings and may include unintentional dictation errors.  MRN: [id number] DOB: [birth year]  Subjective:   Devin Moreno is a 43 y.o. male presenting for 1 year 1 day history of persistent intermittent right lower quadrant abdominal pain.  Pain is sharp ranging from 6-7 out of 10.  Symptoms are not constant.  Yesterday he had associated nausea without vomiting.  He did have loose stools today.  No fever, hematuria, dysuria, bloody stool, penile discharge.  Has a history of renal stones but reports that this feels very different.  No known GI history.  No current facility-administered medications for this encounter.  Current Outpatient Medications:    alfuzosin  (UROXATRAL ) 10 MG 24 hr tablet, Take 1 tablet (10 mg total) by mouth daily with breakfast., Disp: 30 tablet, Rfl: 11   benzonatate  (TESSALON  PERLES) 100 MG capsule, Take 2 capsules (200 mg total) by mouth 3 (three) times daily as needed for cough. (Patient not taking: Reported on 01/06/2024), Disp: 20 capsule, Rfl: 0   Dexmethylphenidate HCl (FOCALIN XR) 25 MG CP24, Take 25 mg by mouth daily. (Patient not taking: Reported on 01/06/2024), Disp: , Rfl:    Dexmethylphenidate HCl 25 MG CP24, Take 1 capsule by mouth daily. (Patient not taking: Reported on 01/06/2024), Disp: , Rfl:    doxycycline  (VIBRA -TABS) 100 MG tablet, Take 1 tablet (100 mg total) by mouth 2 (two) times daily. (Patient not taking: Reported on 01/06/2024), Disp: 14 tablet, Rfl: 0   escitalopram  (LEXAPRO ) 10 MG tablet, Take 10 mg by mouth daily., Disp: , Rfl:    predniSONE  (DELTASONE ) 20 MG tablet, Take 2 tablets on day 1 and day 2, then take 1 tablet daily x 5 days (Patient not taking: Reported on 01/06/2024), Disp: 9 tablet, Rfl: 0   tadalafil  (CIALIS ) 5 MG tablet, Take 1 tablet (5 mg total) by mouth daily., Disp: 30 tablet, Rfl: 3   tamsulosin  (FLOMAX ) 0.4 MG CAPS capsule,  Take 1 capsule (0.4 mg total) by mouth daily., Disp: 30 capsule, Rfl: 0   Ubrogepant  (UBRELVY ) 100 MG TABS, Take 1 tablet by mouth daily as needed., Disp: 30 tablet, Rfl: 1   valsartan  (DIOVAN ) 160 MG tablet, Take 1 tablet (160 mg total) by mouth daily. Needs appt, Disp: 30 tablet, Rfl: 0   No Known Allergies  Past Medical History:  Diagnosis Date   Acute bronchitis due to COVID-19 virus 07/19/2020   Cough 07/18/2020   Hypertension      Past Surgical History:  Procedure Laterality Date   WISDOM TOOTH EXTRACTION      Family History  Problem Relation Age of Onset   Healthy Mother    Hypertension Father    Heart disease Father    Tuberculosis Maternal Grandmother    Prostate cancer Maternal Grandfather     Social History   Tobacco Use   Smoking status: Former    Current packs/day: 1.00    Average packs/day: 1 pack/day for 16.0 years (16.0 ttl pk-yrs)    Types: Cigarettes    Passive exposure: Never   Smokeless tobacco: Never  Vaping Use   Vaping status: Never Used  Substance Use Topics   Alcohol use: Yes    Alcohol/week: 7.0 standard drinks of alcohol    Types: 7 Cans of beer per week    Comment: Occassionally   Drug use: Never    ROS   Objective:   Vitals: BP 116/78 (BP Location:  Left Arm)   Pulse 67   Temp 98.1 F (36.7 C) (Oral)   Resp 18   Wt 229 lb 15 oz (104.3 kg)   SpO2 95%   BMI 32.99 kg/m   Physical Exam Constitutional:      General: He is not in acute distress.    Appearance: Normal appearance. He is well-developed and normal weight. He is not ill-appearing, toxic-appearing or diaphoretic.  HENT:     Head: Normocephalic and atraumatic.     Right Ear: External ear normal.     Left Ear: External ear normal.     Nose: Nose normal.     Mouth/Throat:     Mouth: Mucous membranes are moist.     Pharynx: Oropharynx is clear.  Eyes:     General: No scleral icterus.       Right eye: No discharge.        Left eye: No discharge.     Extraocular  Movements: Extraocular movements intact.     Conjunctiva/sclera: Conjunctivae normal.  Cardiovascular:     Rate and Rhythm: Normal rate and regular rhythm.     Heart sounds: No murmur heard.    No friction rub. No gallop.  Pulmonary:     Effort: Pulmonary effort is normal. No respiratory distress.     Breath sounds: No wheezing or rales.  Abdominal:     General: Bowel sounds are normal. There is no distension.     Palpations: Abdomen is soft. There is no mass.     Tenderness: There is abdominal tenderness in the right lower quadrant. There is no right CVA tenderness, left CVA tenderness, guarding or rebound.  Musculoskeletal:     Cervical back: Normal range of motion.  Skin:    General: Skin is warm and dry.  Neurological:     Mental Status: He is alert and oriented to person, place, and time.  Psychiatric:        Mood and Affect: Mood normal.        Behavior: Behavior normal.        Thought Content: Thought content normal.        Judgment: Judgment normal.     Assessment and Plan :   PDMP not reviewed this encounter.  1. RLQ abdominal pain   2. Loose stools    Acute abdomen is on the differential including acute diverticulitis, acute appendicitis.  However I suspect this is unlikely given hemodynamically stable vital signs, lack of fever, lack of findings on his physical exam.  Offered urinalysis but patient declined given that previous renal colic is presented very differently for the patient.  Recommended conservative management, supportive care, monitoring for progression of symptoms.  Counseled patient on potential for adverse effects with medications prescribed/recommended today, ER and return-to-clinic precautions discussed, patient verbalized understanding.    Christopher Savannah, [place] 01/31/24 [id number]

## 2024-02-03 NOTE — Telephone Encounter (Signed)
 Pt went to urgent care.

## 2024-02-17 ENCOUNTER — Other Ambulatory Visit: Payer: Self-pay | Admitting: Family

## 2024-03-04 NOTE — Progress Notes (Signed)
   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  This responded to physical therapy in the past.  Currently, symptoms have improved   Plan: - I sent in a prescription for as needed Cialis   -He can use the alfuzosin  on an as needed basis-he has plenty of refills left  -I welcomed him to come back if symptoms recur/are bothersome  History of Present Illness:  7.14.2025:  Initial visit for evaluation of persistent discomfort in his pelvic/penile area, lower urinary tract symptoms.  These symptoms have been going on for quite some time.  He has been seen by Dr. Selma and Nieves at [facility] urology, as recently as 2024.  At that time he had a stone.  He has had cystoscopy there before which was normal.  He had a vasectomy as well.  With his above symptoms, he was referred for physical therapy which, by report in the notes (at least 4 visits), he did have significant improvement in his pelvic symptoms.  At one time he was on both tamsulosin  and tadalafil .  He is off of tamsulosin , still on tadalafil .  Current IPSS 19/4.  No gross hematuria, but he does have dysuria.  Also has pain after ejaculation.  I  recommended starting alfuzosin , weaning off the tadalafil . Also to consider eventual PT.  9.15.2025: Here for recheck. He has been doing better--IPSS 7/2.  He has been taking the alfuzosin  for 2 months.  He would like a prescription for as needed Cialis   Past Medical History:  Past Medical History:  Diagnosis Date   Acute bronchitis due to COVID-19 virus 07/19/2020   Cough 07/18/2020   Hypertension     Past Surgical History:  Past Surgical History:  Procedure Laterality Date   WISDOM TOOTH EXTRACTION      Allergies:  No Known Allergies  Family History:  Family History  Problem Relation Age of Onset   Healthy Mother    Hypertension Father    Heart disease Father    Tuberculosis Maternal Grandmother    Prostate cancer Maternal  Grandfather     Social History:  Social History   Tobacco Use   Smoking status: Former    Current packs/day: 1.00    Average packs/day: 1 pack/day for 16.0 years (16.0 ttl pk-yrs)    Types: Cigarettes    Passive exposure: Never   Smokeless tobacco: Never  Vaping Use   Vaping status: Never Used  Substance Use Topics   Alcohol use: Yes    Alcohol/week: 7.0 standard drinks of alcohol    Types: 7 Cans of beer per week    Comment: Occassionally   Drug use: Never     Results: [facility] notes reviewed  I have reviewed referring/prior physicians notes  I have reviewed urinalysis--clear today  I have reviewed PSA results--0.31 in August 2023  I have reviewed prior imaging--CT transcription from May 2024 reviewed.  At that time no evidence of kidney stones.  Bilateral L4 pars defects, mild progression of degenerative disc disease and grade 1-2 anterolisthesis at L4-L5

## 2024-03-09 ENCOUNTER — Ambulatory Visit: Admitting: Urology

## 2024-03-09 VITALS — BP 145/62 | HR 84 | Ht 70.0 in | Wt 219.0 lb

## 2024-03-09 DIAGNOSIS — N5201 Erectile dysfunction due to arterial insufficiency: Secondary | ICD-10-CM

## 2024-03-09 DIAGNOSIS — R399 Unspecified symptoms and signs involving the genitourinary system: Secondary | ICD-10-CM

## 2024-03-09 DIAGNOSIS — N410 Acute prostatitis: Secondary | ICD-10-CM

## 2024-03-09 LAB — URINALYSIS, ROUTINE W REFLEX MICROSCOPIC
Bilirubin, UA: NEGATIVE
Glucose, UA: NEGATIVE
Ketones, UA: NEGATIVE
Leukocytes,UA: NEGATIVE
Nitrite, UA: NEGATIVE
Protein,UA: NEGATIVE
RBC, UA: NEGATIVE
Specific Gravity, UA: 1.01 (ref 1.005–1.030)
Urobilinogen, Ur: 0.2 mg/dL (ref 0.2–1.0)
pH, UA: 6 (ref 5.0–7.5)

## 2024-03-09 MED ORDER — TADALAFIL 20 MG PO TABS
20.0000 mg | ORAL_TABLET | ORAL | 11 refills | Status: AC | PRN
Start: 2024-03-09 — End: ?

## 2024-04-06 ENCOUNTER — Ambulatory Visit: Admitting: Dermatology

## 2024-04-06 ENCOUNTER — Encounter: Payer: Self-pay | Admitting: Dermatology

## 2024-04-06 VITALS — BP 149/96

## 2024-04-06 DIAGNOSIS — L219 Seborrheic dermatitis, unspecified: Secondary | ICD-10-CM

## 2024-04-06 DIAGNOSIS — L739 Follicular disorder, unspecified: Secondary | ICD-10-CM

## 2024-04-06 MED ORDER — DOXYCYCLINE HYCLATE 100 MG PO TABS
100.0000 mg | ORAL_TABLET | Freq: Every day | ORAL | 0 refills | Status: AC
Start: 2024-04-06 — End: 2024-05-06

## 2024-04-06 MED ORDER — KETOCONAZOLE 2 % EX SHAM
MEDICATED_SHAMPOO | CUTANEOUS | 2 refills | Status: AC
Start: 2024-04-06 — End: ?

## 2024-04-06 MED ORDER — CLOBETASOL PROPIONATE 0.05 % EX SOLN: CUTANEOUS | 5 refills | Status: DC

## 2024-04-06 NOTE — Progress Notes (Signed)
   New Patient Visit   Subjective  Devin Moreno is a 43 y.o. male who presents for a NEW PATIENT appointment to be examined for the concerns as listed below.  Devin Moreno is a 43 year old male who presents with chronic scalp irritation and bumps.  He has a persistent area of irritation on his scalp characterized by itchiness, dryness, and flakiness. Small red spots and bumps are present, which he tends to pick at, leading to a cycle of healing and recurrence. This issue has been ongoing for several years.  He has a history of dandruff and has been using over-the-counter dandruff shampoos such as Head & Shoulders and Selsun Blue without relief. He has also tried Lotrimin and hydrocortisone  cream, suspecting a fungal issue, but these treatments have not been effective.  He [place] a significant amount of [place], often without a hat. He has not seen a dermatologist since he was 43 years old, when a benign mole was removed.  Family history includes a tendency for his sister and father to develop cysts, which can become large and problematic. He notes that the bumps on his scalp sometimes feel like pimples and are not always in the same spots.  The bumps do not cause pain unless picked at, and he has not experienced any skin cancers. He washes his hair daily due to oiliness and inquires about the frequency of changing pillowcases, which he currently does weekly.  Reports Hx of Bx - benign. Denied family Hx of skin cancer.   The following portions of the chart were reviewed this encounter and updated as appropriate: medications, allergies, medical history  Review of Systems:  No other skin or systemic complaints except as noted in HPI or Assessment and Plan.  Objective  Well appearing patient in no apparent distress; mood and affect are within normal limits.   A focused examination was performed of the following areas: scalp   Relevant exam findings are noted in  the Assessment and Plan.        Assessment & Plan    Seborrheic dermatitis of the scalp Chronic seborrheic dermatitis of the scalp with persistent flakiness and itchiness, exacerbated by seasonal changes. Current over-the-counter treatments are ineffective.  - Prescribe ketoconazole shampoo for regular use. Instruct to let it sit for 1-2 minutes before rinsing. - Prescribe clobetasol  liquid drops for spot treatment to alleviate itching. Advise to avoid application on freshly picked areas to prevent stinging. - Advise conditioning if hair becomes dry due to ketoconazole shampoo. - Educate on the chronic nature of seborrheic dermatitis and its management.  Folliculitis of the scalp and face Folliculitis presenting as acneiform eruptions, likely due to bacterial infection or excess sebum. Sun exposure without protection may contribute to skin irritation. Family history of cysts noted. Differential includes potential precancerous lesions due to sun exposure, to be evaluated after inflammation clears.  - Prescribe oral doxycycline  for 1 month to address folliculitis. Instruct to take with food and remain upright for 30 minutes post-ingestion to avoid esophageal irritation. - Reassess in 1 month to evaluate for any precancerous lesions post-inflammation resolution. - Initiate a long-term topical treatment for maintenance after completion of oral doxycycline .   No follow-ups on file.   Documentation: I have reviewed the above documentation for accuracy and completeness, and I agree with the above.  I, Shirron Maranda, CMA, am acting as scribe for [employer], DO.   Delon Lenis, DO

## 2024-04-06 NOTE — Patient Instructions (Addendum)
 VISIT SUMMARY:  During your visit, we discussed your chronic scalp irritation and bumps. You have been experiencing persistent itchiness, dryness, and flakiness on your scalp, along with small red spots and bumps. Despite using over-the-counter treatments, you have not found relief. We also reviewed your family history and outdoor activities, which may contribute to your condition.  YOUR PLAN:  -SEBORRHEIC DERMATITIS OF THE SCALP: Seborrheic dermatitis is a chronic skin condition that causes flakiness and itchiness, often exacerbated by seasonal changes.  We will start you on ketoconazole shampoo to use regularly, letting it sit for 1-2 minutes before rinsing. Additionally, clobetasol  liquid drops will be used for spot treatment to alleviate itching, but avoid applying it on freshly picked areas to prevent stinging.  If your hair becomes dry, you may use a conditioner. This condition is chronic, and ongoing management is necessary.  -FOLLICULITIS OF THE SCALP AND FACE: Folliculitis is an inflammation of the hair follicles, often presenting as acne-like eruptions, which can be due to bacterial infection or excess oil. Sun exposure without protection may also contribute to skin irritation.  We will start you on oral doxycycline  for 1 month to address the folliculitis. Take the medication with food and remain upright for 30 minutes after taking it to avoid irritation.  We will reassess in 1 month to check for any precancerous lesions after the inflammation has cleared. After completing the oral medication, we will initiate a long-term topical treatment for maintenance.  INSTRUCTIONS:  Please follow up in 1 month for a reassessment of your scalp condition and to evaluate for any precancerous lesions after the inflammation has cleared. Important Information  Due to recent changes in healthcare laws, you may see results of your pathology and/or laboratory studies on MyChart before the doctors have had a  chance to review them. We understand that in some cases there may be results that are confusing or concerning to you. Please understand that not all results are received at the same time and often the doctors may need to interpret multiple results in order to provide you with the best plan of care or course of treatment. Therefore, we ask that you please give us  2 business days to thoroughly review all your results before contacting the office for clarification. Should we see a critical lab result, you will be contacted sooner.   If You Need Anything After Your Visit  If you have any questions or concerns for your doctor, please call our main line at [phone] If no one answers, please leave a voicemail as directed and we will return your call as soon as possible. Messages left after 4 pm will be answered the following business day.   You may also send us  a message via MyChart. We typically respond to MyChart messages within 1-2 business days.  For prescription refills, please ask your pharmacy to contact our office. Our fax number is [phone].  If you have an urgent issue when the clinic is closed that cannot wait until the next business day, you can page your doctor at the number below.    Please note that while we do our best to be available for urgent issues outside of office hours, we are not available 24/7.   If you have an urgent issue and are unable to reach us , you may choose to seek medical care at your doctor's office, retail clinic, urgent care center, or emergency room.  If you have a medical emergency, please immediately call 911 or go to the  emergency department. In the event of inclement weather, please call our main line at [phone] for an update on the status of any delays or closures.  Dermatology Medication Tips: Please keep the boxes that topical medications come in in order to help keep track of the instructions about where and how to use these. Pharmacies  typically print the medication instructions only on the boxes and not directly on the medication tubes.   If your medication is too expensive, please contact our office at [phone] or send us  a message through MyChart.   We are unable to tell what your co-pay for medications will be in advance as this is different depending on your insurance coverage. However, we may be able to find a substitute medication at lower cost or fill out paperwork to get insurance to cover a needed medication.   If a prior authorization is required to get your medication covered by your insurance company, please allow us  1-2 business days to complete this process.  Drug prices often vary depending on where the prescription is filled and some pharmacies may offer cheaper prices.  The website www.goodrx.com contains coupons for medications through different pharmacies. The prices here do not account for what the cost may be with help from insurance (it may be cheaper with your insurance), but the website can give you the price if you did not use any insurance.  - You can print the associated coupon and take it with your prescription to the pharmacy.  - You may also stop by our office during regular business hours and pick up a GoodRx coupon card.  - If you need your prescription sent electronically to a different pharmacy, notify our office through [facility] or by phone at [phone]

## 2024-05-18 ENCOUNTER — Encounter: Payer: Self-pay | Admitting: Dermatology

## 2024-05-18 ENCOUNTER — Ambulatory Visit: Admitting: Dermatology

## 2024-05-18 VITALS — BP 129/88

## 2024-05-18 DIAGNOSIS — L739 Follicular disorder, unspecified: Secondary | ICD-10-CM | POA: Diagnosis not present

## 2024-05-18 DIAGNOSIS — L280 Lichen simplex chronicus: Secondary | ICD-10-CM

## 2024-05-18 DIAGNOSIS — L219 Seborrheic dermatitis, unspecified: Secondary | ICD-10-CM

## 2024-05-18 NOTE — Progress Notes (Signed)
   Follow-Up Visit   Subjective  Devin Moreno is a 43 y.o. male established patient who presents for FOLLOW UP on the diagnoses listed below:  Patient was last evaluated on 04/06/24.   SebDerm: Pt stated that he did not need to use clobetasol  drops much after starting to use ketoconazole  shampoo every other day. He stated his Sx are much better and he is no longer getting the raw burning sensation that he was previously getting before.    Folliculitis:  Pt stated that he has completed the 30-day course of Doxycycline .    The following portions of the chart were reviewed this encounter and updated as appropriate: medications, allergies, medical history  Review of Systems:  No other skin or systemic complaints except as noted in HPI or Assessment and Plan.  Objective  Well appearing patient in no apparent distress; mood and affect are within normal limits.   A focused examination was performed of the following areas: scalp   Relevant exam findings are noted in the Assessment and Plan.         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 Symptoms have improved significantly with ketoconazole  shampoo, reducing the need for clobetasol  drops. Occasional scalp manipulation due to ADHD, but no significant itching or burning. Lichenification present due to chronic rubbing, leading to thickened skin patches.  - Continue ketoconazole  shampoo, allowing it to sit for 1-2 minutes before rinsing. - Use clobetasol  drops as needed for spot treatment, ideally every other day. - Prescribed medicated oil treatment to be applied once a week to affected areas, left on for 2-3 hours before washing out. - Provided 3-4 refills for medicated oil treatment. - Will schedule follow-up in one year to monitor progress.    No follow-ups on file.   Documentation: I  have reviewed the above documentation for accuracy and completeness, and I agree with the above.  I, Shirron Maranda, CMA II, am acting as scribe for:  Delon Lenis, DO

## 2024-05-28 ENCOUNTER — Other Ambulatory Visit: Payer: Self-pay | Admitting: Family Medicine

## 2024-05-28 MED ORDER — VALSARTAN 160 MG PO TABS: 160.0000 mg | ORAL_TABLET | Freq: Every day | ORAL | 0 refills | Status: AC

## 2024-05-28 NOTE — Telephone Encounter (Unsigned)
 Copied from CRM #[id number]. Topic: Clinical - Medication Refill >> May 28, 2024 10:34 AM Roselie BROCKS wrote: Medication: valsartan  (DIOVAN ) 160 MG tablet Patient has New patent appointment scheduled  Has the patient contacted their pharmacy? No (Agent: If no, request that the patient contact the pharmacy for the refill. If patient does not wish to contact the pharmacy document the reason why and proceed with request.) (Agent: If yes, when and what did the pharmacy advise?)  This is the patient's preferred pharmacy:  [facility] [address] [place], [place] - [address] [place] [address] Phone: [phone] Fax: [phone]  Is this the correct pharmacy for this prescription? Yes If no, delete pharmacy and type the correct one.   Has the prescription been filled recently? No was 90 day supply  Is the patient out of the medication? Yes  Has the patient been seen for an appointment in the last year OR does the patient have an upcoming appointment? Yes  Can we respond through MyChart? Yes  Agent: Please be advised that Rx refills may take up to 3 business days. We ask that you follow-up with your pharmacy.

## 2024-07-06 ENCOUNTER — Encounter: Payer: Self-pay | Admitting: Dermatology

## 2024-07-06 ENCOUNTER — Other Ambulatory Visit: Payer: Self-pay

## 2024-07-06 DIAGNOSIS — L219 Seborrheic dermatitis, unspecified: Secondary | ICD-10-CM

## 2024-07-06 MED ORDER — FLUOCINOLONE ACETONIDE BODY 0.01 % EX OIL: 1.0000 | TOPICAL_OIL | Freq: Two times a day (BID) | CUTANEOUS | 5 refills | Status: AC

## 2024-07-06 NOTE — Telephone Encounter (Signed)
 Yes, we should have sent an rx for dermasmooth oil to be applied to scalp for a few hours before washing hair.   Can you send that rx to his pharmacy.  Thanks!

## 2024-07-28 ENCOUNTER — Ambulatory Visit: Admitting: Family Medicine

## 2024-07-28 ENCOUNTER — Ambulatory Visit (HOSPITAL_BASED_OUTPATIENT_CLINIC_OR_DEPARTMENT_OTHER)
Admission: RE | Admit: 2024-07-28 | Discharge: 2024-07-28 | Disposition: A | Source: Ambulatory Visit | Attending: Family Medicine

## 2024-07-28 VITALS — BP 128/83 | HR 88 | Temp 98.4°F | Ht 70.0 in | Wt 223.8 lb

## 2024-07-28 DIAGNOSIS — G8929 Other chronic pain: Secondary | ICD-10-CM

## 2024-07-28 DIAGNOSIS — F411 Generalized anxiety disorder: Secondary | ICD-10-CM

## 2024-07-28 DIAGNOSIS — E559 Vitamin D deficiency, unspecified: Secondary | ICD-10-CM

## 2024-07-28 DIAGNOSIS — G43009 Migraine without aura, not intractable, without status migrainosus: Secondary | ICD-10-CM

## 2024-07-28 DIAGNOSIS — Z0000 Encounter for general adult medical examination without abnormal findings: Secondary | ICD-10-CM

## 2024-07-28 DIAGNOSIS — I10 Essential (primary) hypertension: Secondary | ICD-10-CM

## 2024-07-28 DIAGNOSIS — F909 Attention-deficit hyperactivity disorder, unspecified type: Secondary | ICD-10-CM | POA: Insufficient documentation

## 2024-07-28 LAB — COMPREHENSIVE METABOLIC PANEL WITH GFR
ALT: 20 U/L (ref 3–53)
AST: 15 U/L (ref 5–37)
Albumin: 4.7 g/dL (ref 3.5–5.2)
Alkaline Phosphatase: 49 U/L (ref 39–117)
BUN: 13 mg/dL (ref 6–23)
CO2: 33 meq/L — ABNORMAL HIGH (ref 19–32)
Calcium: 9.4 mg/dL (ref 8.4–10.5)
Chloride: 101 meq/L (ref 96–112)
Creatinine, Ser: 1 mg/dL (ref 0.40–1.50)
GFR: 90.99 mL/min
Glucose, Bld: 93 mg/dL (ref 70–99)
Potassium: 4.6 meq/L (ref 3.5–5.1)
Sodium: 139 meq/L (ref 135–145)
Total Bilirubin: 1.7 mg/dL — ABNORMAL HIGH (ref 0.2–1.2)
Total Protein: 6.6 g/dL (ref 6.0–8.3)

## 2024-07-28 LAB — LIPID PANEL
Cholesterol: 171 mg/dL (ref 28–200)
HDL: 50.3 mg/dL
LDL Cholesterol: 99 mg/dL (ref 10–99)
NonHDL: 120.92
Total CHOL/HDL Ratio: 3
Triglycerides: 112 mg/dL (ref 10.0–149.0)
VLDL: 22.4 mg/dL (ref 0.0–40.0)

## 2024-07-28 LAB — CBC WITH DIFFERENTIAL/PLATELET
Basophils Absolute: 0 10*3/uL (ref 0.0–0.1)
Basophils Relative: 0.3 % (ref 0.0–3.0)
Eosinophils Absolute: 0.2 10*3/uL (ref 0.0–0.7)
Eosinophils Relative: 3.1 % (ref 0.0–5.0)
HCT: 46.6 % (ref 39.0–52.0)
Hemoglobin: 15.6 g/dL (ref 13.0–17.0)
Lymphocytes Relative: 35 % (ref 12.0–46.0)
Lymphs Abs: 1.7 10*3/uL (ref 0.7–4.0)
MCHC: 33.6 g/dL (ref 30.0–36.0)
MCV: 86.6 fl (ref 78.0–100.0)
Monocytes Absolute: 0.4 10*3/uL (ref 0.1–1.0)
Monocytes Relative: 8.3 % (ref 3.0–12.0)
Neutro Abs: 2.6 10*3/uL (ref 1.4–7.7)
Neutrophils Relative %: 53.3 % (ref 43.0–77.0)
Platelets: 192 10*3/uL (ref 150.0–400.0)
RBC: 5.38 Mil/uL (ref 4.22–5.81)
RDW: 12.8 % (ref 11.5–15.5)
WBC: 4.9 10*3/uL (ref 4.0–10.5)

## 2024-07-28 LAB — TSH: TSH: 1.07 u[IU]/mL (ref 0.35–5.50)

## 2024-07-28 LAB — VITAMIN D 25 HYDROXY (VIT D DEFICIENCY, FRACTURES): VITD: 26.09 ng/mL — ABNORMAL LOW (ref 30.00–100.00)

## 2024-07-28 NOTE — Assessment & Plan Note (Signed)
 Very well controlled. No recent use of Ubrelvy  needed.

## 2024-07-28 NOTE — Assessment & Plan Note (Signed)
 Labs today.

## 2024-07-30 ENCOUNTER — Ambulatory Visit: Payer: Self-pay | Admitting: Family Medicine

## 2024-09-08 ENCOUNTER — Ambulatory Visit: Admitting: Dermatology
